# Patient Record
Sex: Female | Born: 1950
Health system: Southern US, Community
[De-identification: ages and names within clinical notes are randomized; demographics above are authoritative.]

## PROBLEM LIST (undated history)

## (undated) DIAGNOSIS — Z8673 Personal history of transient ischemic attack (TIA), and cerebral infarction without residual deficits: Secondary | ICD-10-CM

## (undated) DIAGNOSIS — F419 Anxiety disorder, unspecified: Secondary | ICD-10-CM

## (undated) DIAGNOSIS — M1990 Unspecified osteoarthritis, unspecified site: Secondary | ICD-10-CM

## (undated) DIAGNOSIS — R200 Anesthesia of skin: Secondary | ICD-10-CM

## (undated) DIAGNOSIS — Z9889 Other specified postprocedural states: Secondary | ICD-10-CM

## (undated) DIAGNOSIS — E039 Hypothyroidism, unspecified: Secondary | ICD-10-CM

## (undated) DIAGNOSIS — R112 Nausea with vomiting, unspecified: Secondary | ICD-10-CM

## (undated) DIAGNOSIS — I10 Essential (primary) hypertension: Secondary | ICD-10-CM

## (undated) DIAGNOSIS — K219 Gastro-esophageal reflux disease without esophagitis: Secondary | ICD-10-CM

## (undated) HISTORY — PX: CARPAL TUNNEL RELEASE: SHX101

## (undated) HISTORY — DX: Personal history of transient ischemic attack (TIA), and cerebral infarction without residual deficits: Z86.73

## (undated) HISTORY — PX: CHOLECYSTECTOMY: SHX55

---

## 2001-09-29 ENCOUNTER — Emergency Department (HOSPITAL_COMMUNITY): Admission: EM | Admit: 2001-09-29 | Discharge: 2001-09-29 | Payer: Self-pay | Admitting: Emergency Medicine

## 2001-09-29 ENCOUNTER — Encounter: Payer: Self-pay | Admitting: Emergency Medicine

## 2002-03-29 ENCOUNTER — Encounter: Payer: Self-pay | Admitting: Obstetrics and Gynecology

## 2002-03-29 ENCOUNTER — Ambulatory Visit (HOSPITAL_COMMUNITY): Admission: RE | Admit: 2002-03-29 | Discharge: 2002-03-29 | Payer: Self-pay | Admitting: Obstetrics and Gynecology

## 2002-04-02 ENCOUNTER — Ambulatory Visit (HOSPITAL_COMMUNITY): Admission: RE | Admit: 2002-04-02 | Discharge: 2002-04-02 | Payer: Self-pay | Admitting: Obstetrics and Gynecology

## 2002-04-02 ENCOUNTER — Encounter: Payer: Self-pay | Admitting: Obstetrics and Gynecology

## 2002-12-15 ENCOUNTER — Other Ambulatory Visit: Admission: RE | Admit: 2002-12-15 | Discharge: 2002-12-15 | Payer: Self-pay | Admitting: Obstetrics & Gynecology

## 2004-02-20 ENCOUNTER — Ambulatory Visit (HOSPITAL_COMMUNITY): Admission: RE | Admit: 2004-02-20 | Discharge: 2004-02-20 | Payer: Self-pay | Admitting: Family Medicine

## 2004-05-09 ENCOUNTER — Ambulatory Visit (HOSPITAL_COMMUNITY): Admission: RE | Admit: 2004-05-09 | Discharge: 2004-05-09 | Payer: Self-pay | Admitting: Internal Medicine

## 2005-01-23 ENCOUNTER — Ambulatory Visit (HOSPITAL_COMMUNITY): Admission: RE | Admit: 2005-01-23 | Discharge: 2005-01-23 | Payer: Self-pay | Admitting: Internal Medicine

## 2005-03-20 ENCOUNTER — Emergency Department (HOSPITAL_COMMUNITY): Admission: EM | Admit: 2005-03-20 | Discharge: 2005-03-21 | Payer: Self-pay | Admitting: *Deleted

## 2005-08-06 ENCOUNTER — Ambulatory Visit (HOSPITAL_COMMUNITY): Admission: RE | Admit: 2005-08-06 | Discharge: 2005-08-06 | Payer: Self-pay | Admitting: Internal Medicine

## 2006-10-29 ENCOUNTER — Ambulatory Visit (HOSPITAL_COMMUNITY): Admission: RE | Admit: 2006-10-29 | Discharge: 2006-10-29 | Payer: Self-pay | Admitting: Obstetrics and Gynecology

## 2007-03-30 ENCOUNTER — Emergency Department (HOSPITAL_COMMUNITY): Admission: EM | Admit: 2007-03-30 | Discharge: 2007-03-30 | Payer: Self-pay | Admitting: Emergency Medicine

## 2010-08-21 ENCOUNTER — Ambulatory Visit (HOSPITAL_COMMUNITY): Admission: RE | Admit: 2010-08-21 | Payer: Self-pay | Admitting: Family Medicine

## 2010-10-27 ENCOUNTER — Encounter: Payer: Self-pay | Admitting: Family Medicine

## 2010-11-14 ENCOUNTER — Other Ambulatory Visit: Payer: Self-pay | Admitting: Obstetrics and Gynecology

## 2010-11-14 DIAGNOSIS — Z139 Encounter for screening, unspecified: Secondary | ICD-10-CM

## 2010-11-19 ENCOUNTER — Ambulatory Visit (HOSPITAL_COMMUNITY)
Admission: RE | Admit: 2010-11-19 | Discharge: 2010-11-19 | Disposition: A | Discharge status: Home / Self Care | Payer: BC Managed Care – PPO | Source: Ambulatory Visit | Attending: Obstetrics and Gynecology | Admitting: Obstetrics and Gynecology

## 2010-11-19 DIAGNOSIS — Z1231 Encounter for screening mammogram for malignant neoplasm of breast: Secondary | ICD-10-CM | POA: Insufficient documentation

## 2010-11-19 DIAGNOSIS — Z139 Encounter for screening, unspecified: Secondary | ICD-10-CM

## 2011-02-22 NOTE — Consult Note (Signed)
NAME:  Holly Callahan, Holly Callahan                          ACCOUNT NO.:  [id number]   MEDICAL RECORD NO.:  [id number]                  PATIENT TYPE:   LOCATION:                                       FACILITY:   PHYSICIAN:  Lionel December, M.D.                 DATE OF BIRTH:  10/05/1958   DATE OF CONSULTATION:  DATE OF DISCHARGE:                                   CONSULTATION  (CORRECTED COPY)   Burning in esophagus and belching.   HISTORY OF PRESENT ILLNESS:  The patient states that approximately one week  ago she had an episode of esophageal burning and has been experiencing  increasing belching.  The patient has a several year history of GERD, for  which she takes Nexium 40 mg one q.d.  She stated that the esophageal  burning was unlike the reflux she had experienced in the past and she became  concerned.  The patient denies swallowing difficulties, nausea, vomiting or  hematemesis.  The patient states that she normally has one formed stool a  day.  She denies hematochezia or melena.  The patient states that she has  never had an EGD for her persistent GERD and, at age 60, has never had a  screening colonoscopy.  The patient states that she saw her primary care  physician, Dr. Nobie Putnam, who gave her dicyclomine 10 mg q.i.d., and she  states that her symptoms have resolved on this medication and with  continuing her Nexium.   CURRENT MEDICATIONS:  1. Xanax 0.5 mg p.r.n.  2. Nexium 40 mg one q.d.  3. Lexapro one q.d.  4. HCTZ 25 mg one q.d.  5. Dicyclomine 10 mg q.i.d.   PAST MEDICAL HISTORY:  1. Hypertension.  2. GERD, for which she took over the counter medications for several years     before starting on Prilosec and then starting Nexium.  3. The patient has had cholecystectomy in the remote past, she states more     than 10 years ago, and this was done by Dr. Katrinka Blazing.  4. The patient had a Cesarean delivery in 1988.   ALLERGIES:  The patient has no known drug allergies.   FAMILY  HISTORY:  The patient's mother is deceased; cause of death was a  stroke.  The patient's father is deceased, also of a stroke, and the  patient's father did have peptic ulcer disease.  The patient has seven  brothers, one of whom died in an automobile accident and the remaining six  are in good health.   SOCIAL HISTORY:  The patient is married.  She lives with her husband.  She  is employed at the [employer] as a Runner, broadcasting/film/video.  She denies the use  of tobacco, alcohol or street drugs.   PHYSICAL EXAMINATION:  HEENT:  Normocephalic, atraumatic.  Conjunctivae are  pink.  Sclerae are clear and nonicteric.  There are no masses,  lymphadenopathy palpated.  SKIN:  Dry and warm and without jaundice.  EXTREMITIES:  No edema, clubbing or cyanosis.  HEART:  Regular rate and rhythm without murmurs, rubs or gallops.  RESPIRATIONS:  Clear to auscultation bilaterally.  ABDOMEN:  Soft, nondistended, active bowel sounds times four, no tenderness  to palpation, no masses or hepatosplenomegaly.   The patient had a CT of the abdomen with contrast.  This was done at [facility] on Feb 20, 2004.  The liver, spleen, pancreas, adrenal glands  and kidneys appear normal.  The gallbladder has been removed.  There is no  adenopathy or bowel dilatation.  No free air or free fluid.  Impression:  Negative CT scan of the abdomen.   CT scan of the pelvis with contrast:  The appendix is prominent, measuring 7  to 8 mm in diameter and extending into the right side of the pelvis.  There  is no periappendicular inflammation.  Impression:  No acute abnormality.   IMPRESSION:  Long term gastroesophageal reflux disease, which has been  stable until this episode one week ago.  The patient's symptoms are being  controlled on dicyclomine and Nexium at this point.  The patient has never  had an esophagogastroduodenoscopy in spite of her long term gastroesophageal  reflux disease and this should be scheduled.   It is recommended that in the  near future, the patient have a screening colonoscopy, which could certainly  wait until this episode of exacerbation of gastroesophageal reflux disease  symptoms has been managed.   RECOMMENDATIONS:  Schedule patient for an EGD for exacerbation of chronic  GERD symptoms and consider scheduling the patient for a colonoscopy at a  near future date.   Thank you for allowing [place] to see this very pleasant patient.     ________________________________________  ___________________________________________  Ashok Pall, PA                           Lionel December, M.D.   GC/MEDQ  D:  02/22/2004  T:  02/23/2004  Job:  [id number]   cc:   Patrica Duel, M.D.  [address]  [place]  [place] [address]  Fax: [phone]

## 2011-02-22 NOTE — Op Note (Signed)
NAME:  Holly Callahan, Holly Callahan                          ACCOUNT NO.:  [id number]   MEDICAL RECORD NO.:  [id number]                   PATIENT TYPE:  AMB   LOCATION:  DAY                                  FACILITY:  [facility]   PHYSICIAN:  Lionel December, M.D.                 DATE OF BIRTH:  [birth year]   DATE OF PROCEDURE:  DATE OF DISCHARGE:                                 OPERATIVE REPORT   PROCEDURE:  Esophagogastroduodenoscopy.   INDICATIONS FOR PROCEDURE:  [name] is a 60 year old Caucasian female who has  at least a five-year history of GERD with intermittent symptom flareup.  She  is presently doing much better with the Nexium and some change in her eating  habits.  She is undergoing EGD to make sure she does not have Barrett's  esophagus.  The procedure risks were reviewed with the patient, and informed  consent for the procedure was obtained.   PREOPERATIVE MEDICATIONS:  Cetacaine spray for pharyngeal topical  anesthesia, Demerol 50 mg IV, Versed 10 mg IV in divided dose.   FINDINGS:  The procedure was performed in the endoscopy suite.  The  patient's vital signs and O2 saturations were monitored during the procedure  and remained stable.  The patient was placed in the left lateral recumbent  position, and the Olympus videoscope was passed via the oropharynx without  any difficulty into the esophagus.   Esophagus:  The mucosa of the esophagus was normal.  The squamocolumnar  junction was wavy, and there was a small sliding hiatal hernia no more than  3 cm in length.   Stomach:  It was empty and distended very well with insufflation.  The folds  of the proximal stomach were normal.  Examination of  the mucosa revealed  linear erosions at the antrum and some erythema, but no ulcer crater was  noted.  The pyloric channel was patent.  The angularis, fundus, and cardia  were examined by retroflexing the scope and were normal.   Duodenum:  Examination of the bulb revealed normal mucosa.  The  scope was  advanced to the postbulbar duodenum where the mucosa and folds were normal.  The endoscope was withdrawn.  The patient tolerated the procedure well.   FINAL DIAGNOSES:  1. Small sliding hiatal hernia without endoscopic changes of esophagitis or     Barrett's esophagus.  2. Erosive antral gastritis.   RECOMMENDATIONS:  1. She will continue antireflux measures and Nexium at 40 mg q.a.m.  Since     she is doing so well, if she wants to     she could try every other day.  2. H pylori serology will be checked.  3. The patient will call us when she is ready to undergo screening     colonoscopy.      ___________________________________________  Lionel December, M.D.   NR/MEDQ  D:  05/09/2004  T:  05/10/2004  Job:  [id number]   cc:   Patrica Duel, M.D.  [address]  [place]  [place] [address]  Fax: [phone]

## 2011-10-09 ENCOUNTER — Other Ambulatory Visit: Payer: Self-pay

## 2011-10-09 ENCOUNTER — Telehealth: Payer: Self-pay

## 2011-10-09 DIAGNOSIS — Z139 Encounter for screening, unspecified: Secondary | ICD-10-CM

## 2011-10-09 NOTE — Telephone Encounter (Signed)
Gastroenterology Pre-Procedure Form   Request Date: 10/09/2011       Requesting Physician: Dr. Sherwood Gambler     PATIENT INFORMATION:  Holly Callahan is a 61 y.o., female (DOB=[birth year]).  PROCEDURE: Procedure(s) requested: colonoscopy Procedure Reason: screening for colon cancer  PATIENT REVIEW QUESTIONS: The patient reports the following:   1. Diabetes Melitis: no 2. Joint replacements in the past 12 months: no 3. Major health problems in the past 3 months: no 4. Has an artificial valve or MVP:no 5. Has been advised in past to take antibiotics in advance of a procedure like teeth cleaning: no}    MEDICATIONS & ALLERGIES:    Patient reports the following regarding taking any blood thinners:   Plavix? no Aspirin?yes  Coumadin?  no  Patient confirms/reports the following medications:  Current Outpatient Prescriptions  Medication Sig Dispense Refill  . ALPRAZolam (XANAX) 0.5 MG tablet Take 0.5 mg by mouth 1 day or 1 dose. Pt takes only one tablet daily in the AM       . aspirin 81 MG tablet Take 160 mg by mouth daily.        . fish oil-omega-3 fatty acids 1000 MG capsule Take 1 g by mouth 2 (two) times daily.        . hydrochlorothiazide (HYDRODIURIL) 25 MG tablet Take 25 mg by mouth daily.        Marland Kitchen levothyroxine (SYNTHROID, LEVOTHROID) 25 MCG tablet Take 25 mcg by mouth daily.        . Multiple Vitamin (MULTIVITAMIN) tablet Take 1 tablet by mouth daily.        . NON FORMULARY Calcium 600 mg plus Vit D    One tablet twice daily         Patient confirms/reports the following allergies:  No Known Allergies  Patient is appropriate to schedule for requested procedure(s): yes  AUTHORIZATION INFORMATION Primary Insurance:   ID #:   Group #:  Pre-Cert / Auth required:  Pre-Cert / Auth #:   Secondary Insurance:   ID #:   Group #:  Pre-Cert / Auth required: Pre-Cert / Auth #:   No orders of the defined types were placed in this encounter.    SCHEDULE INFORMATION: Procedure has  been scheduled as follows:  Date: 10/28/2011      Time: 8:30 AM  Location: [facility] Short Stay  This Gastroenterology Pre-Precedure Form is being routed to the following provider(s) for review: R. Roetta Sessions, MD

## 2011-10-09 NOTE — Telephone Encounter (Signed)
OK as is.

## 2011-10-10 NOTE — Telephone Encounter (Signed)
Rx and instructions mailed to pt.  

## 2011-10-22 ENCOUNTER — Encounter (HOSPITAL_COMMUNITY): Payer: Self-pay | Admitting: Pharmacy Technician

## 2011-10-25 MED ORDER — SODIUM CHLORIDE 0.45 % IV SOLN
Freq: Once | INTRAVENOUS | Status: AC
  Administered 2011-10-28: 08:00:00 via INTRAVENOUS

## 2011-10-28 ENCOUNTER — Encounter (HOSPITAL_COMMUNITY)
Admission: RE | Disposition: A | Discharge status: Home / Self Care | Payer: Self-pay | Source: Ambulatory Visit | Attending: Internal Medicine

## 2011-10-28 ENCOUNTER — Other Ambulatory Visit: Payer: Self-pay | Admitting: Internal Medicine

## 2011-10-28 ENCOUNTER — Encounter (HOSPITAL_COMMUNITY): Payer: Self-pay | Admitting: *Deleted

## 2011-10-28 ENCOUNTER — Ambulatory Visit (HOSPITAL_COMMUNITY)
Admission: RE | Admit: 2011-10-28 | Discharge: 2011-10-28 | Disposition: A | Discharge status: Home / Self Care | Payer: BC Managed Care – PPO | Source: Ambulatory Visit | Attending: Internal Medicine | Admitting: Internal Medicine

## 2011-10-28 DIAGNOSIS — D126 Benign neoplasm of colon, unspecified: Secondary | ICD-10-CM | POA: Insufficient documentation

## 2011-10-28 DIAGNOSIS — Z1211 Encounter for screening for malignant neoplasm of colon: Secondary | ICD-10-CM

## 2011-10-28 DIAGNOSIS — Z139 Encounter for screening, unspecified: Secondary | ICD-10-CM

## 2011-10-28 DIAGNOSIS — I10 Essential (primary) hypertension: Secondary | ICD-10-CM | POA: Insufficient documentation

## 2011-10-28 DIAGNOSIS — Z79899 Other long term (current) drug therapy: Secondary | ICD-10-CM | POA: Insufficient documentation

## 2011-10-28 DIAGNOSIS — Z7982 Long term (current) use of aspirin: Secondary | ICD-10-CM | POA: Insufficient documentation

## 2011-10-28 DIAGNOSIS — R933 Abnormal findings on diagnostic imaging of other parts of digestive tract: Secondary | ICD-10-CM

## 2011-10-28 HISTORY — DX: Hypothyroidism, unspecified: E03.9

## 2011-10-28 HISTORY — DX: Essential (primary) hypertension: I10

## 2011-10-28 HISTORY — PX: COLONOSCOPY: SHX5424

## 2011-10-28 SURGERY — COLONOSCOPY
Anesthesia: Moderate Sedation

## 2011-10-28 MED ORDER — MIDAZOLAM HCL 5 MG/5ML IJ SOLN
INTRAMUSCULAR | Status: AC
  Filled 2011-10-28: qty 10

## 2011-10-28 MED ORDER — LIDOCAINE HCL 2 % EX GEL
CUTANEOUS | Status: AC
  Filled 2011-10-28: qty 30

## 2011-10-28 MED ORDER — STERILE WATER FOR IRRIGATION IR SOLN
Status: DC | PRN
  Administered 2011-10-28: 09:00:00

## 2011-10-28 MED ORDER — MEPERIDINE HCL 100 MG/ML IJ SOLN
INTRAMUSCULAR | Status: AC
  Filled 2011-10-28: qty 2

## 2011-10-28 MED ORDER — MEPERIDINE HCL 100 MG/ML IJ SOLN
INTRAMUSCULAR | Status: DC | PRN
  Administered 2011-10-28: 50 mg via INTRAVENOUS

## 2011-10-28 MED ORDER — MIDAZOLAM HCL 5 MG/5ML IJ SOLN
INTRAMUSCULAR | Status: DC | PRN
  Administered 2011-10-28 (×3): 1 mg via INTRAVENOUS
  Administered 2011-10-28: 2 mg via INTRAVENOUS

## 2011-10-28 NOTE — Op Note (Signed)
[facility] [address] [place], [place]  [address]  COLONOSCOPY PROCEDURE REPORT  PATIENT:  Holly Callahan, Holly Callahan  MR#:  [id number] BIRTHDATE:  [birth year], 60 yrs. old  GENDER:  female ENDOSCOPIST:  R. Roetta Sessions, MD FACP [facility] REF. BY:                   Artis Delay, M.D. PROCEDURE DATE:  10/28/2011 PROCEDURE:  Colonoscopy with biopsy and snare polypectomy  INDICATIONS:  First-ever average risk screening colonoscopy  INFORMED CONSENT:  The risks, benefits, alternatives and imponderables including but not limited to bleeding, perforation as well as the possibility of a missed lesion have been reviewed. The potential for biopsy, lesion removal, etc. have also been discussed.  Questions have been answered.  All parties agreeable. Please see the history and physical in the medical record for more information.  MEDICATIONS:  Versed 5 mg IV and Demerol 50 mg IV in divided doses.  DESCRIPTION OF PROCEDURE:  After a digital rectal exam was performed, the EC-[id number] ([id number]) colonoscope was advanced from the anus through the rectum and colon to the area of the cecum, ileocecal valve and appendiceal orifice.  The cecum was deeply intubated.  These structures were well-seen and photographed for the record.  From the level of the cecum and ileocecal valve, the scope was slowly and cautiously withdrawn.  The mucosal surfaces were carefully surveyed utilizing scope tip deflection to facilitate fold flattening as needed.  The scope was pulled down into the rectum where a thorough examination including retroflexion was performed. <<PROCEDUREIMAGES>>  FINDINGS:   adequate preparation(however, was suboptimal on the right side). Anal papilla; otherwise normal rectum. 6 mm polyp  in the ascending          colon. Slightly irregular mucosa at the opening of the ileocecal valve. May well be a variant of normal (image 3). Otherwise, normal appearing  colonic mucosa  THERAPEUTIC /  DIAGNOSTIC MANEUVERS PERFORMED:  polyp in the right colon removed with hot snare cautery. Abnormal appearing ileocecal valve cold biopsied  COMPLICATIONS:  none  CECAL WITHDRAWAL TIME:   16 minutes  IMPRESSION:  Colonic polyp. Abnormal appearing ileocecal  valve. Intervention as outlined above.  RECOMMENDATIONS:     Follow up on pathology  ______________________________ R. Roetta Sessions, MD Caleen Essex  CC:  Artis Delay, M.D.  n. eSIGNED:   R. Roetta Sessions at 10/28/2011 09:31 AM  Brendolyn Patty, [phone]

## 2011-10-28 NOTE — H&P (Signed)
Primary Care Physician:  Cassell Smiles., MD, MD Primary Gastroenterologist:  Dr.  Jena Gauss  Pre-Procedure History & Physical: HPI:  Holly Callahan is a 61 y.o. female is here for a screening colonoscopy.  No bowel symptoms. No prior colonoscopy. No family history of colon cancer or colon polyps.   Past Medical History  Diagnosis Date  . Hypertension   . Hypothyroidism     Past Surgical History  Procedure Date  . Cholecystectomy   . Cesarean section 1988    Prior to Admission medications   Medication Sig Start Date End Date Taking? Authorizing Provider  ALPRAZolam Prudy Feeler) 0.5 MG tablet Take 0.5 mg by mouth 4 (four) times daily as needed. Pt takes only one tablet daily in the AM and sometimes 1/2 tab at night if she feels she needs to   Yes Historical Provider, MD  aspirin 81 MG tablet Take 81 mg by mouth at bedtime.    Yes Historical Provider, MD  fish oil-omega-3 fatty acids 1000 MG capsule Take 1 g by mouth 2 (two) times daily.     Yes Historical Provider, MD  hydrochlorothiazide (HYDRODIURIL) 25 MG tablet Take 25 mg by mouth daily.     Yes Historical Provider, MD  levothyroxine (SYNTHROID, LEVOTHROID) 25 MCG tablet Take 25 mcg by mouth daily.     Yes Historical Provider, MD  Multiple Vitamin (MULTIVITAMIN) tablet Take 1 tablet by mouth daily.     Yes Historical Provider, MD  NON FORMULARY Calcium 600 mg plus Vit D    One tablet twice daily    Yes Historical Provider, MD    Allergies as of 10/09/2011  . (Not on File)    History reviewed. No pertinent family history.  History   Social History  . Marital Status: Married    Spouse Name: N/A    Number of Children: N/A  . Years of Education: N/A   Occupational History  . Not on file.   Social History Main Topics  . Smoking status: Never Smoker   . Smokeless tobacco: Not on file  . Alcohol Use: No  . Drug Use: No  . Sexually Active:    Other Topics Concern  . Not on file   Social History Narrative  . No narrative  on file    Review of Systems: See HPI, otherwise negative ROS  Physical Exam: BP 154/78  Pulse 57  Temp(Src) 97.7 F (36.5 C) (Oral)  Resp 16  Ht 5\' 3"  (1.6 m)  Wt 150 lb (68.04 kg)  BMI 26.57 kg/m2  SpO2 100% General:   Alert,  Well-developed, well-nourished, pleasant and cooperative in NAD Head:  Normocephalic and atraumatic. Eyes:  Sclera clear, no icterus.   Conjunctiva pink. Ears:  Normal auditory acuity. Nose:  No deformity, discharge,  or lesions. Mouth:  No deformity or lesions, dentition normal. Neck:  Supple; no masses or thyromegaly. Lungs:  Clear throughout to auscultation.   No wheezes, crackles, or rhonchi. No acute distress. Heart:  Regular rate and rhythm; no murmurs, clicks, rubs,  or gallops. Abdomen: Nondistended. Positive bowel sounds soft nontender without appreciable mass or organomegaly Msk:  Symmetrical without gross deformities. Normal posture. Pulses:  Normal pulses noted. Extremities:  Without clubbing or edema. Neurologic:  Alert and  oriented x4;  grossly normal neurologically. Skin:  Intact without significant lesions or rashes. Cervical Nodes:  No significant cervical adenopathy. Psych:  Alert and cooperative. Normal mood and affect.  Impression/Plan: Holly Callahan is now here to undergo a  screening colonoscopy. First-ever average risk screening examination.  Risks, benefits, limitations, imponderables and alternatives regarding colonoscopy have been reviewed with the patient. Questions have been answered. All parties agreeable.

## 2011-11-03 ENCOUNTER — Encounter: Payer: Self-pay | Admitting: Internal Medicine

## 2011-11-04 ENCOUNTER — Encounter (HOSPITAL_COMMUNITY): Payer: Self-pay | Admitting: Internal Medicine

## 2012-06-10 ENCOUNTER — Other Ambulatory Visit: Payer: Self-pay | Admitting: Adult Health

## 2012-06-10 DIAGNOSIS — Z139 Encounter for screening, unspecified: Secondary | ICD-10-CM

## 2012-06-15 ENCOUNTER — Ambulatory Visit (HOSPITAL_COMMUNITY): Payer: BC Managed Care – PPO

## 2012-06-19 ENCOUNTER — Ambulatory Visit (HOSPITAL_COMMUNITY)
Admission: RE | Admit: 2012-06-19 | Discharge: 2012-06-19 | Disposition: A | Discharge status: Home / Self Care | Payer: BC Managed Care – PPO | Source: Ambulatory Visit | Attending: Adult Health | Admitting: Adult Health

## 2012-06-19 DIAGNOSIS — Z139 Encounter for screening, unspecified: Secondary | ICD-10-CM

## 2012-06-19 DIAGNOSIS — Z1231 Encounter for screening mammogram for malignant neoplasm of breast: Secondary | ICD-10-CM | POA: Insufficient documentation

## 2012-10-07 DIAGNOSIS — G459 Transient cerebral ischemic attack, unspecified: Secondary | ICD-10-CM

## 2012-10-07 HISTORY — DX: Transient cerebral ischemic attack, unspecified: G45.9

## 2015-07-11 ENCOUNTER — Other Ambulatory Visit: Payer: Self-pay | Admitting: Surgical

## 2015-09-21 NOTE — H&P (Signed)
TOTAL KNEE ADMISSION H&P  Patient is being admitted for right total knee arthroplasty.  Subjective:  Chief Complaint:right knee pain.  HPI: Holly Callahan, 64 y.o. female, has a history of pain and functional disability in the right knee due to arthritis and has failed non-surgical conservative treatments for greater than 12 weeks to includeNSAID's and/or analgesics, corticosteriod injections, flexibility and strengthening excercises and activity modification.  Onset of symptoms was gradual, starting >10 years ago with gradually worsening course since that time. The patient noted no past surgery on the right knee(s).  Patient currently rates pain in the right knee(s) at 7 out of 10 with activity. Patient has night pain, worsening of pain with activity and weight bearing, pain that interferes with activities of daily living, pain with passive range of motion, crepitus and joint swelling.  Patient has evidence of periarticular osteophytes and joint space narrowing by imaging studies.  There is no active infection.   Past Medical History  Diagnosis Date  . Hypertension   . Hypothyroidism     Past Surgical History  Procedure Laterality Date  . Cholecystectomy    . Cesarean section  1988  . Colonoscopy  10/28/2011    Procedure: COLONOSCOPY;  Surgeon: Daneil Dolin, MD;  Location: [facility] ENDO SUITE;  Service: Endoscopy;  Laterality: N/A;  8:30 AM      Current outpatient prescriptions:  .  ALPRAZolam (XANAX) 0.5 MG tablet, Take 0.25-0.5 mg by mouth 4 (four) times daily as needed for anxiety or sleep. Pt usually only takes only 1/2 tab at night, Disp: , Rfl:  .  aspirin 81 MG tablet, Take 81 mg by mouth at bedtime. , Disp: , Rfl:  .  Calcium Carb-Cholecalciferol (CALCIUM 600 + D PO), Take 1 tablet by mouth 2 (two) times daily., Disp: , Rfl:  .  fish oil-omega-3 fatty acids 1000 MG capsule, Take 1 g by mouth 2 (two) times daily.  , Disp: , Rfl:  .  hydrochlorothiazide (HYDRODIURIL) 25 MG tablet,  Take 25 mg by mouth daily.  , Disp: , Rfl:  .  ibuprofen (ADVIL,MOTRIN) 200 MG tablet, Take 400 mg by mouth every 6 (six) hours as needed for mild pain., Disp: , Rfl:  .  levothyroxine (SYNTHROID, LEVOTHROID) 25 MCG tablet, Take 25 mcg by mouth daily.  , Disp: , Rfl:  .  omeprazole (PRILOSEC) 40 MG capsule, Take 40 mg by mouth daily., Disp: , Rfl:   No Known Allergies  Social History  Substance Use Topics  . Smoking status: Never Smoker   . Smokeless tobacco: No  . Alcohol Use: No      Review of Systems  Constitutional: Negative.   HENT: Negative.   Eyes: Negative.   Respiratory: Negative.   Cardiovascular: Negative.   Gastrointestinal: Positive for heartburn. Negative for nausea, vomiting, abdominal pain, diarrhea, constipation, blood in stool and melena.  Genitourinary: Positive for frequency. Negative for dysuria, urgency, hematuria and flank pain.  Musculoskeletal: Positive for joint pain. Negative for myalgias, back pain, falls and neck pain.       Right knee pain  Skin: Negative.   Neurological: Negative.   Endo/Heme/Allergies: Negative.   Psychiatric/Behavioral: Negative.     Objective:  Physical Exam  Constitutional: She is oriented to person, place, and time. She appears well-developed and well-nourished. No distress.  HENT:  Head: Normocephalic and atraumatic.  Right Ear: External ear normal.  Left Ear: External ear normal.  Nose: Nose normal.  Mouth/Throat: Oropharynx is clear and moist.  Eyes: Conjunctivae and EOM are normal.  Neck: Normal range of motion. Neck supple.  Cardiovascular: Normal rate, regular rhythm, normal heart sounds and intact distal pulses.   No murmur heard. Respiratory: Effort normal and breath sounds normal. No respiratory distress. She has no wheezes.  GI: Soft. She exhibits no distension. There is no tenderness.  Musculoskeletal:       Right hip: Normal.       Left hip: Normal.       Right knee: She exhibits decreased range of  motion and swelling. She exhibits no effusion and no erythema. Tenderness found. Medial joint line and lateral joint line tenderness noted.       Left knee: Normal.  Neurological: She is alert and oriented to person, place, and time. She has normal strength and normal reflexes. No sensory deficit.  Skin: No rash noted. She is not diaphoretic. No erythema.  Psychiatric: She has a normal mood and affect. Her behavior is normal.   Vitals  Weight: 140 lb Height: 62in Body Surface Area: 1.64 m Body Mass Index: 25.61 kg/m  Pulse: 76 (Regular)  BP: 148/84 (Sitting, Left Arm, Standard)  Imaging Review Plain radiographs demonstrate severe degenerative joint disease of the right knee(s). The overall alignment ismild varus. The bone quality appears to be excellent for age and reported activity level.  Assessment/Plan:  End stage primary osteoarthritis, right knee   The patient history, physical examination, clinical judgment of the provider and imaging studies are consistent with end stage degenerative joint disease of the right knee(s) and total knee arthroplasty is deemed medically necessary. The treatment options including medical management, injection therapy arthroscopy and arthroplasty were discussed at length. The risks and benefits of total knee arthroplasty were presented and reviewed. The risks due to aseptic loosening, infection, stiffness, patella tracking problems, thromboembolic complications and other imponderables were discussed. The patient acknowledged the explanation, agreed to proceed with the plan and consent was signed. Patient is being admitted for inpatient treatment for surgery, pain control, PT, OT, prophylactic antibiotics, VTE prophylaxis, progressive ambulation and ADL's and discharge planning. The patient is planning to be discharged home with home health services    PCP: Dr. Francia Greaves, PA-C

## 2015-09-27 ENCOUNTER — Encounter (HOSPITAL_COMMUNITY)
Admission: RE | Admit: 2015-09-27 | Discharge: 2015-09-27 | Disposition: A | Discharge status: Home / Self Care | Payer: 59 | Source: Ambulatory Visit | Attending: Orthopedic Surgery | Admitting: Orthopedic Surgery

## 2015-09-27 ENCOUNTER — Encounter (HOSPITAL_COMMUNITY): Payer: Self-pay

## 2015-09-27 ENCOUNTER — Ambulatory Visit (HOSPITAL_COMMUNITY)
Admission: RE | Admit: 2015-09-27 | Discharge: 2015-09-27 | Disposition: A | Discharge status: Home / Self Care | Payer: 59 | Source: Ambulatory Visit | Attending: Surgical | Admitting: Surgical

## 2015-09-27 DIAGNOSIS — Z01818 Encounter for other preprocedural examination: Secondary | ICD-10-CM | POA: Insufficient documentation

## 2015-09-27 DIAGNOSIS — M5134 Other intervertebral disc degeneration, thoracic region: Secondary | ICD-10-CM | POA: Diagnosis not present

## 2015-09-27 HISTORY — DX: Unspecified osteoarthritis, unspecified site: M19.90

## 2015-09-27 HISTORY — DX: Anxiety disorder, unspecified: F41.9

## 2015-09-27 HISTORY — DX: Gastro-esophageal reflux disease without esophagitis: K21.9

## 2015-09-27 HISTORY — DX: Anesthesia of skin: R20.0

## 2015-09-27 HISTORY — DX: Nausea with vomiting, unspecified: R11.2

## 2015-09-27 HISTORY — DX: Nausea with vomiting, unspecified: Z98.890

## 2015-09-27 LAB — URINALYSIS, ROUTINE W REFLEX MICROSCOPIC
Bilirubin Urine: NEGATIVE
Glucose, UA: NEGATIVE mg/dL
Hgb urine dipstick: NEGATIVE
Ketones, ur: NEGATIVE mg/dL
Leukocytes, UA: NEGATIVE
Nitrite: NEGATIVE
Protein, ur: NEGATIVE mg/dL
Specific Gravity, Urine: 1.012 (ref 1.005–1.030)
pH: 6.5 (ref 5.0–8.0)

## 2015-09-27 LAB — CBC WITH DIFFERENTIAL/PLATELET
Basophils Absolute: 0 10*3/uL (ref 0.0–0.1)
Basophils Relative: 0 %
Eosinophils Absolute: 0.1 10*3/uL (ref 0.0–0.7)
Eosinophils Relative: 2 %
HCT: 39.4 % (ref 36.0–46.0)
Hemoglobin: 13 g/dL (ref 12.0–15.0)
Lymphocytes Relative: 30 %
Lymphs Abs: 1.8 10*3/uL (ref 0.7–4.0)
MCH: 29.3 pg (ref 26.0–34.0)
MCHC: 33 g/dL (ref 30.0–36.0)
MCV: 88.7 fL (ref 78.0–100.0)
Monocytes Absolute: 0.4 10*3/uL (ref 0.1–1.0)
Monocytes Relative: 7 %
Neutro Abs: 3.8 10*3/uL (ref 1.7–7.7)
Neutrophils Relative %: 61 %
Platelets: 305 10*3/uL (ref 150–400)
RBC: 4.44 MIL/uL (ref 3.87–5.11)
RDW: 12.5 % (ref 11.5–15.5)
WBC: 6.2 10*3/uL (ref 4.0–10.5)

## 2015-09-27 LAB — PROTIME-INR
INR: 0.97 (ref 0.00–1.49)
Prothrombin Time: 13.1 seconds (ref 11.6–15.2)

## 2015-09-27 LAB — COMPREHENSIVE METABOLIC PANEL
ALT: 37 U/L (ref 14–54)
AST: 31 U/L (ref 15–41)
Albumin: 4.1 g/dL (ref 3.5–5.0)
Alkaline Phosphatase: 79 U/L (ref 38–126)
Anion gap: 9 (ref 5–15)
BUN: 24 mg/dL — ABNORMAL HIGH (ref 6–20)
CO2: 27 mmol/L (ref 22–32)
Calcium: 9.6 mg/dL (ref 8.9–10.3)
Chloride: 103 mmol/L (ref 101–111)
Creatinine, Ser: 0.92 mg/dL (ref 0.44–1.00)
GFR calc Af Amer: >60 mL/min (ref >60)
GFR calc non Af Amer: >60 mL/min (ref >60)
Glucose, Bld: 132 mg/dL — ABNORMAL HIGH (ref 65–99)
Potassium: 4 mmol/L (ref 3.5–5.1)
Sodium: 139 mmol/L (ref 135–145)
Total Bilirubin: 0.5 mg/dL (ref 0.3–1.2)
Total Protein: 7.3 g/dL (ref 6.5–8.1)

## 2015-09-27 LAB — ABO/RH: ABO/RH(D): O POS

## 2015-09-27 LAB — SURGICAL PCR SCREEN
MRSA, PCR: NEGATIVE
Staphylococcus aureus: NEGATIVE

## 2015-09-27 NOTE — Patient Instructions (Signed)
Holly Callahan  09/27/2015   Your procedure is scheduled on: Wednesday October 04, 2015   Report to [facility] Main  Entrance take [place]  elevators to 3rd floor to  [place] at 5:30 AM.  Call this number if you have problems the morning of surgery [phone]   Remember: ONLY 1 PERSON MAY GO WITH YOU TO SHORT STAY TO GET  READY MORNING OF [place].  Do not eat food or drink liquids :After Midnight.     Take these medicines the morning of surgery with A SIP OF WATER: Alprazolam (Xanax) if needed; Levothyroxine; Omeprazole (Prilosec)                                 You may not have any metal on your body including hair pins and              piercings  Do not wear jewelry, make-up, lotions, powders or perfumes, deodorant             Do not wear nail polish.  Do not shave  48 hours prior to surgery.               Do not bring valuables to the hospital. [place].  Contacts, dentures or bridgework may not be worn into surgery.  Leave suitcase in the car. After surgery it may be brought to your room.              Please read over the following fact sheets you were given:MRSA INFORMATION SHEET; INCENTIVE SPIROMETER; BLOOD TRANSFUSION INFORMATION SHEET  _____________________________________________________________________             [facility] - Preparing for Surgery Before surgery, you can play an important role.  Because skin is not sterile, your skin needs to be as free of germs as possible.  You can reduce the number of germs on your skin by washing with CHG (chlorahexidine gluconate) soap before surgery.  CHG is an antiseptic cleaner which kills germs and bonds with the skin to continue killing germs even after washing. Please DO NOT use if you have an allergy to CHG or antibacterial soaps.  If your skin becomes reddened/irritated stop using the CHG and inform your nurse when you arrive at Short  Stay. Do not shave (including legs and underarms) for at least 48 hours prior to the first CHG shower.  You may shave your face/neck. Please follow these instructions carefully:  1.  Shower with CHG Soap the night before surgery and the  morning of Surgery.  2.  If you choose to wash your hair, wash your hair first as usual with your  normal  shampoo.  3.  After you shampoo, rinse your hair and body thoroughly to remove the  shampoo.                           4.  Use CHG as you would any other liquid soap.  You can apply chg directly  to the skin and wash                       Gently with a scrungie or  clean washcloth.  5.  Apply the CHG Soap to your body ONLY FROM THE NECK DOWN.   Do not use on face/ open                           Wound or open sores. Avoid contact with eyes, ears mouth and genitals (private parts).                       Wash face,  Genitals (private parts) with your normal soap.             6.  Wash thoroughly, paying special attention to the area where your surgery  will be performed.  7.  Thoroughly rinse your body with warm water from the neck down.  8.  DO NOT shower/wash with your normal soap after using and rinsing off  the CHG Soap.                9.  Pat yourself dry with a clean towel.            10.  Wear clean pajamas.            11.  Place clean sheets on your bed the night of your first shower and do not  sleep with pets. Day of Surgery : Do not apply any lotions/deodorants the morning of surgery.  Please wear clean clothes to the hospital/surgery center.  FAILURE TO FOLLOW THESE INSTRUCTIONS MAY RESULT IN THE CANCELLATION OF YOUR SURGERY PATIENT SIGNATURE_________________________________  NURSE SIGNATURE__________________________________  ________________________________________________________________________   Adam Phenix  An incentive spirometer is a tool that can help keep your lungs clear and active. This tool measures how well you are  filling your lungs with each breath. Taking long deep breaths may help reverse or decrease the chance of developing breathing (pulmonary) problems (especially infection) following:  A long period of time when you are unable to move or be active. BEFORE THE PROCEDURE   If the spirometer includes an indicator to show your best effort, your nurse or respiratory therapist will set it to a desired goal.  If possible, sit up straight or lean slightly forward. Try not to slouch.  Hold the incentive spirometer in an upright position. INSTRUCTIONS FOR USE   Sit on the edge of your bed if possible, or sit up as far as you can in bed or on a chair.  Hold the incentive spirometer in an upright position.  Breathe out normally.  Place the mouthpiece in your mouth and seal your lips tightly around it.  Breathe in slowly and as deeply as possible, raising the piston or the ball toward the top of the column.  Hold your breath for 3-5 seconds or for as long as possible. Allow the piston or ball to fall to the bottom of the column.  Remove the mouthpiece from your mouth and breathe out normally.  Rest for a few seconds and repeat Steps 1 through 7 at least 10 times every 1-2 hours when you are awake. Take your time and take a few normal breaths between deep breaths.  The spirometer may include an indicator to show your best effort. Use the indicator as a goal to work toward during each repetition.  After each set of 10 deep breaths, practice coughing to be sure your lungs are clear. If you have an incision (the cut made at the time of surgery), support your incision when coughing  by placing a pillow or rolled up towels firmly against it. Once you are able to get out of bed, walk around indoors and cough well. You may stop using the incentive spirometer when instructed by your caregiver.  RISKS AND COMPLICATIONS  Take your time so you do not get dizzy or light-headed.  If you are in pain, you may  need to take or ask for pain medication before doing incentive spirometry. It is harder to take a deep breath if you are having pain. AFTER USE  Rest and breathe slowly and easily.  It can be helpful to keep track of a log of your progress. Your caregiver can provide you with a simple table to help with this. If you are using the spirometer at home, follow these instructions: [place] IF:   You are having difficultly using the spirometer.  You have trouble using the spirometer as often as instructed.  Your pain medication is not giving enough relief while using the spirometer.  You develop fever of 100.5 F (38.1 C) or higher. SEEK IMMEDIATE MEDICAL CARE IF:   You cough up bloody sputum that had not been present before.  You develop fever of 102 F (38.9 C) or greater.  You develop worsening pain at or near the incision site. MAKE SURE YOU:   Understand these instructions.  Will watch your condition.  Will get help right away if you are not doing well or get worse. Document Released: 02/03/2007 Document Revised: 12/16/2011 Document Reviewed: 04/06/2007 Exit[employer] Patient Information 2014 [employer], [place].   ________________________________________________________________________  WHAT IS A BLOOD TRANSFUSION? Blood Transfusion Information  A transfusion is the replacement of blood or some of its parts. Blood is made up of multiple cells which provide different functions.  Red blood cells carry oxygen and are used for blood loss replacement.  White blood cells fight against infection.  Platelets control bleeding.  Plasma helps clot blood.  Other blood products are available for specialized needs, such as hemophilia or other clotting disorders. BEFORE THE TRANSFUSION  Who gives blood for transfusions?   Healthy volunteers who are fully evaluated to make sure their blood is safe. This is blood bank blood. Transfusion therapy is the safest it has ever been in  the practice of medicine. Before blood is taken from a donor, a complete history is taken to make sure that person has no history of diseases nor engages in risky social behavior (examples are intravenous drug use or sexual activity with multiple partners). The donor's travel history is screened to minimize risk of transmitting infections, such as malaria. The donated blood is tested for signs of infectious diseases, such as HIV and hepatitis. The blood is then tested to be sure it is compatible with you in order to minimize the chance of a transfusion reaction. If you or a relative donates blood, this is often done in anticipation of surgery and is not appropriate for emergency situations. It takes many days to process the donated blood. RISKS AND COMPLICATIONS Although transfusion therapy is very safe and saves many lives, the main dangers of transfusion include:   Getting an infectious disease.  Developing a transfusion reaction. This is an allergic reaction to something in the blood you were given. Every precaution is taken to prevent this. The decision to have a blood transfusion has been considered carefully by your caregiver before blood is given. Blood is not given unless the benefits outweigh the risks. AFTER THE TRANSFUSION  Right after receiving a  blood transfusion, you will usually feel much better and more energetic. This is especially true if your red blood cells have gotten low (anemic). The transfusion raises the level of the red blood cells which carry oxygen, and this usually causes an energy increase.  The nurse administering the transfusion will monitor you carefully for complications. HOME CARE INSTRUCTIONS  No special instructions are needed after a transfusion. You may find your energy is better. Speak with your caregiver about any limitations on activity for underlying diseases you may have. SEEK MEDICAL CARE IF:   Your condition is not improving after your  transfusion.  You develop redness or irritation at the intravenous (IV) site. SEEK IMMEDIATE MEDICAL CARE IF:  Any of the following symptoms occur over the next 12 hours:  Shaking chills.  You have a temperature by mouth above 102 F (38.9 C), not controlled by medicine.  Chest, back, or muscle pain.  People around you feel you are not acting correctly or are confused.  Shortness of breath or difficulty breathing.  Dizziness and fainting.  You get a rash or develop hives.  You have a decrease in urine output.  Your urine turns a dark color or changes to pink, red, or brown. Any of the following symptoms occur over the next 10 days:  You have a temperature by mouth above 102 F (38.9 C), not controlled by medicine.  Shortness of breath.  Weakness after normal activity.  The white part of the eye turns yellow (jaundice).  You have a decrease in the amount of urine or are urinating less often.  Your urine turns a dark color or changes to pink, red, or brown. Document Released: 09/20/2000 Document Revised: 12/16/2011 Document Reviewed: 05/09/2008 [facility] Patient Information 2014 [employer], [place].  _______________________________________________________________________

## 2015-09-27 NOTE — Progress Notes (Signed)
CMP results in epic per PAT visit 09/27/2015 sent to Dr Gladstone Lighter

## 2015-09-28 NOTE — Progress Notes (Signed)
Dr B Judd/anesthesia reviewed pts H&P and EKG 09/27/2015/epic. No orders given. Anesthesia to see pt day of surgery.

## 2015-10-03 ENCOUNTER — Encounter (HOSPITAL_COMMUNITY): Payer: Self-pay | Admitting: Anesthesiology

## 2015-10-03 NOTE — Anesthesia Preprocedure Evaluation (Addendum)
Anesthesia Evaluation  Patient identified by MRN, date of birth, ID band Patient awake    Reviewed: Allergy & Precautions, NPO status , Patient's Chart, lab work & pertinent test results  History of Anesthesia Complications (+) PONV and history of anesthetic complications  Airway Mallampati: II  TM Distance: >3 FB Neck ROM: Full    Dental no notable dental hx.    Pulmonary neg pulmonary ROS,  CXR: Mild chronic bronchitic changes   Pulmonary exam normal breath sounds clear to auscultation       Cardiovascular hypertension, Pt. on medications Normal cardiovascular exam Rhythm:Regular Rate:Normal  ECG: NSR, rate 65, NS TWA   Neuro/Psych Anxiety negative neurological ROS     GI/Hepatic Neg liver ROS, GERD  Medicated,  Endo/Other  Hypothyroidism   Renal/GU negative Renal ROS  negative genitourinary   Musculoskeletal  (+) Arthritis ,   Abdominal   Peds negative pediatric ROS (+)  Hematology negative hematology ROS (+)   Anesthesia Other Findings   Reproductive/Obstetrics negative OB ROS                            Anesthesia Physical Anesthesia Plan  ASA: II  Anesthesia Plan: Spinal   Post-op Pain Management:    Induction: Intravenous  Airway Management Planned: Natural Airway  Additional Equipment:   Intra-op Plan:   Post-operative Plan:   Informed Consent: I have reviewed the patients History and Physical, chart, labs and discussed the procedure including the risks, benefits and alternatives for the proposed anesthesia with the patient or authorized representative who has indicated his/her understanding and acceptance.   Dental advisory given  Plan Discussed with: CRNA  Anesthesia Plan Comments: (Discussed risks and benefits of and differences between spinal and general. Discussed risks of spinal including headache, backache, failure, bleeding and hematoma, infection, and  nerve damage. Patient consents to spinal. Questions answered. Coagulation studies and platelet count acceptable.)       Anesthesia Quick Evaluation

## 2015-10-04 ENCOUNTER — Encounter (HOSPITAL_COMMUNITY): Payer: Self-pay | Admitting: *Deleted

## 2015-10-04 ENCOUNTER — Encounter (HOSPITAL_COMMUNITY)
Admission: RE | Disposition: A | Discharge status: Home Health | Payer: Self-pay | Source: Ambulatory Visit | Attending: Orthopedic Surgery

## 2015-10-04 ENCOUNTER — Inpatient Hospital Stay (HOSPITAL_COMMUNITY)
Admission: RE | Admit: 2015-10-04 | Discharge: 2015-10-06 | DRG: 470 | Disposition: A | Discharge status: Home Health | Payer: 59 | Source: Ambulatory Visit | Attending: Orthopedic Surgery | Admitting: Orthopedic Surgery

## 2015-10-04 ENCOUNTER — Inpatient Hospital Stay (HOSPITAL_COMMUNITY): Payer: 59 | Admitting: Anesthesiology

## 2015-10-04 DIAGNOSIS — E039 Hypothyroidism, unspecified: Secondary | ICD-10-CM | POA: Diagnosis present

## 2015-10-04 DIAGNOSIS — Z7982 Long term (current) use of aspirin: Secondary | ICD-10-CM

## 2015-10-04 DIAGNOSIS — F419 Anxiety disorder, unspecified: Secondary | ICD-10-CM | POA: Diagnosis present

## 2015-10-04 DIAGNOSIS — I10 Essential (primary) hypertension: Secondary | ICD-10-CM | POA: Diagnosis present

## 2015-10-04 DIAGNOSIS — K219 Gastro-esophageal reflux disease without esophagitis: Secondary | ICD-10-CM | POA: Diagnosis present

## 2015-10-04 DIAGNOSIS — Z96659 Presence of unspecified artificial knee joint: Secondary | ICD-10-CM

## 2015-10-04 DIAGNOSIS — M1711 Unilateral primary osteoarthritis, right knee: Principal | ICD-10-CM | POA: Diagnosis present

## 2015-10-04 DIAGNOSIS — M25561 Pain in right knee: Secondary | ICD-10-CM | POA: Diagnosis present

## 2015-10-04 DIAGNOSIS — Z79899 Other long term (current) drug therapy: Secondary | ICD-10-CM

## 2015-10-04 HISTORY — PX: TOTAL KNEE ARTHROPLASTY: SHX125

## 2015-10-04 LAB — TYPE AND SCREEN
ABO/RH(D): O POS
Antibody Screen: NEGATIVE

## 2015-10-04 SURGERY — ARTHROPLASTY, KNEE, TOTAL
Anesthesia: Spinal | Site: Knee | Laterality: Right

## 2015-10-04 MED ORDER — PROPOFOL 500 MG/50ML IV EMUL
INTRAVENOUS | Status: DC | PRN
  Administered 2015-10-04: 50 ug/kg/min via INTRAVENOUS

## 2015-10-04 MED ORDER — PROPOFOL 10 MG/ML IV BOLUS
INTRAVENOUS | Status: AC
  Filled 2015-10-04: qty 20

## 2015-10-04 MED ORDER — CHLORHEXIDINE GLUCONATE 4 % EX LIQD: 60.0000 mL | Freq: Once | CUTANEOUS | Status: DC

## 2015-10-04 MED ORDER — BUPIVACAINE IN DEXTROSE 0.75-8.25 % IT SOLN
INTRATHECAL | Status: DC | PRN
  Administered 2015-10-04: 2 mL via INTRATHECAL

## 2015-10-04 MED ORDER — POLYETHYLENE GLYCOL 3350 17 G PO PACK: 17.0000 g | PACK | Freq: Every day | ORAL | Status: DC | PRN

## 2015-10-04 MED ORDER — LIDOCAINE HCL (CARDIAC) 20 MG/ML IV SOLN
INTRAVENOUS | Status: DC | PRN
  Administered 2015-10-04: 100 mg via INTRAVENOUS

## 2015-10-04 MED ORDER — METHOCARBAMOL 1000 MG/10ML IJ SOLN
500.0000 mg | Freq: Four times a day (QID) | INTRAVENOUS | Status: DC | PRN
  Administered 2015-10-04: 500 mg via INTRAVENOUS
  Filled 2015-10-04 (×2): qty 5

## 2015-10-04 MED ORDER — EPHEDRINE SULFATE 50 MG/ML IJ SOLN
INTRAMUSCULAR | Status: AC
  Filled 2015-10-04: qty 1

## 2015-10-04 MED ORDER — SODIUM CHLORIDE 0.9 % IJ SOLN
INTRAMUSCULAR | Status: AC
  Filled 2015-10-04: qty 50

## 2015-10-04 MED ORDER — RIVAROXABAN 10 MG PO TABS
10.0000 mg | ORAL_TABLET | Freq: Every day | ORAL | Status: DC
Start: 2015-10-05 — End: 2015-10-06
  Administered 2015-10-05 – 2015-10-06 (×2): 10 mg via ORAL
  Filled 2015-10-04 (×3): qty 1

## 2015-10-04 MED ORDER — LACTATED RINGERS IV SOLN
INTRAVENOUS | Status: DC
  Administered 2015-10-04: 21:00:00 via INTRAVENOUS
  Administered 2015-10-04: 100 mL/h via INTRAVENOUS
  Administered 2015-10-05: 07:00:00 via INTRAVENOUS

## 2015-10-04 MED ORDER — METHOCARBAMOL 500 MG PO TABS
500.0000 mg | ORAL_TABLET | Freq: Four times a day (QID) | ORAL | Status: DC | PRN
  Administered 2015-10-04 – 2015-10-06 (×6): 500 mg via ORAL
  Filled 2015-10-04 (×6): qty 1

## 2015-10-04 MED ORDER — ONDANSETRON HCL 4 MG PO TABS
4.0000 mg | ORAL_TABLET | Freq: Four times a day (QID) | ORAL | Status: DC | PRN
  Administered 2015-10-05: 4 mg via ORAL
  Filled 2015-10-04: qty 1

## 2015-10-04 MED ORDER — FLEET ENEMA 7-19 GM/118ML RE ENEM: 1.0000 | ENEMA | Freq: Once | RECTAL | Status: DC | PRN

## 2015-10-04 MED ORDER — OXYCODONE-ACETAMINOPHEN 5-325 MG PO TABS
2.0000 | ORAL_TABLET | ORAL | Status: DC | PRN
  Administered 2015-10-04 – 2015-10-06 (×11): 2 via ORAL
  Filled 2015-10-04 (×11): qty 2

## 2015-10-04 MED ORDER — PROMETHAZINE HCL 25 MG/ML IJ SOLN
6.2500 mg | Freq: Four times a day (QID) | INTRAMUSCULAR | Status: DC | PRN
  Administered 2015-10-04: 6.25 mg via INTRAVENOUS
  Filled 2015-10-04: qty 1

## 2015-10-04 MED ORDER — HYDROMORPHONE HCL 1 MG/ML IJ SOLN
1.0000 mg | INTRAMUSCULAR | Status: DC | PRN
  Administered 2015-10-05 – 2015-10-06 (×2): 0.5 mg via INTRAVENOUS
  Filled 2015-10-04 (×2): qty 1

## 2015-10-04 MED ORDER — PROMETHAZINE HCL 25 MG/ML IJ SOLN: 6.2500 mg | INTRAMUSCULAR | Status: DC | PRN

## 2015-10-04 MED ORDER — FENTANYL CITRATE (PF) 100 MCG/2ML IJ SOLN
INTRAMUSCULAR | Status: AC
  Filled 2015-10-04: qty 2

## 2015-10-04 MED ORDER — BUPIVACAINE-EPINEPHRINE (PF) 0.25% -1:200000 IJ SOLN
INTRAMUSCULAR | Status: AC
  Filled 2015-10-04: qty 30

## 2015-10-04 MED ORDER — LACTATED RINGERS IV SOLN
INTRAVENOUS | Status: DC
  Administered 2015-10-04: 07:00:00 via INTRAVENOUS

## 2015-10-04 MED ORDER — CEFAZOLIN SODIUM 1-5 GM-% IV SOLN
1.0000 g | Freq: Four times a day (QID) | INTRAVENOUS | Status: AC
  Administered 2015-10-04 (×2): 1 g via INTRAVENOUS
  Filled 2015-10-04 (×2): qty 50

## 2015-10-04 MED ORDER — MENTHOL 3 MG MT LOZG: 1.0000 | LOZENGE | OROMUCOSAL | Status: DC | PRN

## 2015-10-04 MED ORDER — DEXAMETHASONE SODIUM PHOSPHATE 10 MG/ML IJ SOLN
INTRAMUSCULAR | Status: AC
  Filled 2015-10-04: qty 1

## 2015-10-04 MED ORDER — SODIUM CHLORIDE 0.9 % IJ SOLN
INTRAMUSCULAR | Status: DC | PRN
  Administered 2015-10-04: 20 mL

## 2015-10-04 MED ORDER — LEVOTHYROXINE SODIUM 25 MCG PO TABS
25.0000 ug | ORAL_TABLET | Freq: Every day | ORAL | Status: DC
  Administered 2015-10-05 – 2015-10-06 (×2): 25 ug via ORAL
  Filled 2015-10-04 (×3): qty 1

## 2015-10-04 MED ORDER — DEXAMETHASONE SODIUM PHOSPHATE 10 MG/ML IJ SOLN
INTRAMUSCULAR | Status: DC | PRN
  Administered 2015-10-04: 10 mg via INTRAVENOUS

## 2015-10-04 MED ORDER — ONDANSETRON HCL 4 MG/2ML IJ SOLN
INTRAMUSCULAR | Status: AC
  Filled 2015-10-04: qty 2

## 2015-10-04 MED ORDER — MIDAZOLAM HCL 2 MG/2ML IJ SOLN
INTRAMUSCULAR | Status: AC
  Filled 2015-10-04: qty 2

## 2015-10-04 MED ORDER — POLYMYXIN B SULFATE 500000 UNITS IJ SOLR
INTRAMUSCULAR | Status: AC
  Filled 2015-10-04: qty 1

## 2015-10-04 MED ORDER — BUPIVACAINE LIPOSOME 1.3 % IJ SUSP
INTRAMUSCULAR | Status: DC | PRN
  Administered 2015-10-04: 20 mL

## 2015-10-04 MED ORDER — THROMBIN 5000 UNITS EX SOLR
CUTANEOUS | Status: AC
  Filled 2015-10-04: qty 5000

## 2015-10-04 MED ORDER — ALUM & MAG HYDROXIDE-SIMETH 200-200-20 MG/5ML PO SUSP: 30.0000 mL | ORAL | Status: DC | PRN

## 2015-10-04 MED ORDER — BUPIVACAINE HCL (PF) 0.25 % IJ SOLN
INTRAMUSCULAR | Status: DC | PRN
  Administered 2015-10-04: 20 mL

## 2015-10-04 MED ORDER — TRANEXAMIC ACID 1000 MG/10ML IV SOLN
1000.0000 mg | INTRAVENOUS | Status: AC
  Administered 2015-10-04: 1000 mg via INTRAVENOUS
  Filled 2015-10-04: qty 10

## 2015-10-04 MED ORDER — FENTANYL CITRATE (PF) 100 MCG/2ML IJ SOLN
INTRAMUSCULAR | Status: DC | PRN
  Administered 2015-10-04: 25 ug via INTRAVENOUS
  Administered 2015-10-04: 50 ug via INTRAVENOUS
  Administered 2015-10-04: 25 ug via INTRAVENOUS

## 2015-10-04 MED ORDER — BACITRACIN-NEOMYCIN-POLYMYXIN 400-5-5000 EX OINT
TOPICAL_OINTMENT | CUTANEOUS | Status: AC
  Filled 2015-10-04: qty 1

## 2015-10-04 MED ORDER — ACETAMINOPHEN 325 MG PO TABS: 650.0000 mg | ORAL_TABLET | Freq: Four times a day (QID) | ORAL | Status: DC | PRN

## 2015-10-04 MED ORDER — ACETAMINOPHEN 650 MG RE SUPP: 650.0000 mg | Freq: Four times a day (QID) | RECTAL | Status: DC | PRN

## 2015-10-04 MED ORDER — PHENYLEPHRINE HCL 10 MG/ML IJ SOLN
INTRAMUSCULAR | Status: DC | PRN
  Administered 2015-10-04: 40 ug via INTRAVENOUS

## 2015-10-04 MED ORDER — LIDOCAINE HCL (CARDIAC) 20 MG/ML IV SOLN
INTRAVENOUS | Status: AC
  Filled 2015-10-04: qty 5

## 2015-10-04 MED ORDER — ONDANSETRON HCL 4 MG/2ML IJ SOLN
INTRAMUSCULAR | Status: DC | PRN
  Administered 2015-10-04: 4 mg via INTRAVENOUS

## 2015-10-04 MED ORDER — MIDAZOLAM HCL 5 MG/5ML IJ SOLN
INTRAMUSCULAR | Status: DC | PRN
  Administered 2015-10-04 (×2): 1 mg via INTRAVENOUS

## 2015-10-04 MED ORDER — FERROUS SULFATE 325 (65 FE) MG PO TABS
325.0000 mg | ORAL_TABLET | Freq: Three times a day (TID) | ORAL | Status: DC
  Filled 2015-10-04 (×8): qty 1

## 2015-10-04 MED ORDER — SODIUM CHLORIDE 0.9 % IJ SOLN
INTRAMUSCULAR | Status: AC
  Filled 2015-10-04: qty 10

## 2015-10-04 MED ORDER — HYDROCODONE-ACETAMINOPHEN 5-325 MG PO TABS: 1.0000 | ORAL_TABLET | ORAL | Status: DC | PRN

## 2015-10-04 MED ORDER — CEFAZOLIN SODIUM-DEXTROSE 2-3 GM-% IV SOLR
INTRAVENOUS | Status: AC
  Filled 2015-10-04: qty 50

## 2015-10-04 MED ORDER — SODIUM CHLORIDE 0.9 % IR SOLN
Status: DC | PRN
  Administered 2015-10-04: 500 mL

## 2015-10-04 MED ORDER — CEFAZOLIN SODIUM-DEXTROSE 2-3 GM-% IV SOLR
2.0000 g | INTRAVENOUS | Status: AC
  Administered 2015-10-04: 2 g via INTRAVENOUS

## 2015-10-04 MED ORDER — CELECOXIB 200 MG PO CAPS
200.0000 mg | ORAL_CAPSULE | Freq: Two times a day (BID) | ORAL | Status: DC
  Administered 2015-10-04 – 2015-10-06 (×4): 200 mg via ORAL
  Filled 2015-10-04 (×7): qty 1

## 2015-10-04 MED ORDER — HYDROCHLOROTHIAZIDE 25 MG PO TABS
25.0000 mg | ORAL_TABLET | Freq: Every day | ORAL | Status: DC
  Administered 2015-10-04 – 2015-10-06 (×3): 25 mg via ORAL
  Filled 2015-10-04 (×3): qty 1

## 2015-10-04 MED ORDER — PHENYLEPHRINE 40 MCG/ML (10ML) SYRINGE FOR IV PUSH (FOR BLOOD PRESSURE SUPPORT)
PREFILLED_SYRINGE | INTRAVENOUS | Status: AC
  Filled 2015-10-04: qty 10

## 2015-10-04 MED ORDER — ALPRAZOLAM 0.25 MG PO TABS: 0.2500 mg | ORAL_TABLET | Freq: Four times a day (QID) | ORAL | Status: DC | PRN

## 2015-10-04 MED ORDER — BISACODYL 5 MG PO TBEC: 5.0000 mg | DELAYED_RELEASE_TABLET | Freq: Every day | ORAL | Status: DC | PRN

## 2015-10-04 MED ORDER — PANTOPRAZOLE SODIUM 40 MG PO TBEC
40.0000 mg | DELAYED_RELEASE_TABLET | Freq: Every day | ORAL | Status: DC
  Administered 2015-10-05 – 2015-10-06 (×2): 40 mg via ORAL
  Filled 2015-10-04 (×3): qty 1

## 2015-10-04 MED ORDER — ONDANSETRON HCL 4 MG/2ML IJ SOLN
4.0000 mg | Freq: Four times a day (QID) | INTRAMUSCULAR | Status: DC | PRN
  Administered 2015-10-04: 4 mg via INTRAVENOUS
  Filled 2015-10-04: qty 2

## 2015-10-04 MED ORDER — BUPIVACAINE LIPOSOME 1.3 % IJ SUSP
20.0000 mL | Freq: Once | INTRAMUSCULAR | Status: DC
  Filled 2015-10-04: qty 20

## 2015-10-04 MED ORDER — PHENOL 1.4 % MT LIQD: 1.0000 | OROMUCOSAL | Status: DC | PRN

## 2015-10-04 MED ORDER — EPHEDRINE SULFATE 50 MG/ML IJ SOLN
INTRAMUSCULAR | Status: DC | PRN
  Administered 2015-10-04 (×2): 5 mg via INTRAVENOUS

## 2015-10-04 MED ORDER — HYDROMORPHONE HCL 1 MG/ML IJ SOLN: 0.2500 mg | INTRAMUSCULAR | Status: DC | PRN

## 2015-10-04 MED ORDER — BUPIVACAINE HCL (PF) 0.25 % IJ SOLN
INTRAMUSCULAR | Status: AC
  Filled 2015-10-04: qty 30

## 2015-10-04 SURGICAL SUPPLY — 60 items
BAG DECANTER FOR FLEXI CONT (MISCELLANEOUS) IMPLANT
BAG ZIPLOCK 12X15 (MISCELLANEOUS) IMPLANT
BANDAGE ACE 4X5 VEL STRL LF (GAUZE/BANDAGES/DRESSINGS) ×2 IMPLANT
BANDAGE ACE 6X5 VEL STRL LF (GAUZE/BANDAGES/DRESSINGS) ×2 IMPLANT
BLADE SAG 18X100X1.27 (BLADE) ×2 IMPLANT
BLADE SAW SGTL 11.0X1.19X90.0M (BLADE) ×2 IMPLANT
BONE CEMENT GENTAMICIN (Cement) ×4 IMPLANT
CAP KNEE TOTAL 3 SIGMA ×2 IMPLANT
CEMENT BONE GENTAMICIN 40 (Cement) ×2 IMPLANT
CLOTH BEACON ORANGE TIMEOUT ST (SAFETY) ×2 IMPLANT
CUFF TOURN SGL QUICK 34 (TOURNIQUET CUFF) ×1
CUFF TRNQT CYL 34X4X40X1 (TOURNIQUET CUFF) ×1 IMPLANT
DECANTER SPIKE VIAL GLASS SM (MISCELLANEOUS) ×2 IMPLANT
DRAPE INCISE IOBAN 66X45 STRL (DRAPES) IMPLANT
DRAPE U-SHAPE 47X51 STRL (DRAPES) ×2 IMPLANT
DRSG ADAPTIC 3X8 NADH LF (GAUZE/BANDAGES/DRESSINGS) ×2 IMPLANT
DRSG PAD ABDOMINAL 8X10 ST (GAUZE/BANDAGES/DRESSINGS) ×2 IMPLANT
DURAPREP 26ML APPLICATOR (WOUND CARE) ×2 IMPLANT
ELECT REM PT RETURN 9FT ADLT (ELECTROSURGICAL) ×2
ELECTRODE REM PT RTRN 9FT ADLT (ELECTROSURGICAL) ×1 IMPLANT
EVACUATOR 1/8 PVC DRAIN (DRAIN) ×2 IMPLANT
GAUZE SPONGE 4X4 12PLY STRL (GAUZE/BANDAGES/DRESSINGS) ×2 IMPLANT
GLOVE BIOGEL PI IND STRL 6.5 (GLOVE) ×1 IMPLANT
GLOVE BIOGEL PI IND STRL 8 (GLOVE) ×1 IMPLANT
GLOVE BIOGEL PI INDICATOR 6.5 (GLOVE) ×1
GLOVE BIOGEL PI INDICATOR 8 (GLOVE) ×1
GLOVE ECLIPSE 8.0 STRL XLNG CF (GLOVE) ×4 IMPLANT
GLOVE SURG SS PI 6.5 STRL IVOR (GLOVE) ×2 IMPLANT
GOWN STRL REUS W/TWL LRG LVL3 (GOWN DISPOSABLE) ×2 IMPLANT
GOWN STRL REUS W/TWL XL LVL3 (GOWN DISPOSABLE) ×2 IMPLANT
HANDPIECE INTERPULSE COAX TIP (DISPOSABLE) ×1
IMMOBILIZER KNEE 20 (SOFTGOODS) ×2
IMMOBILIZER KNEE 20 THIGH 36 (SOFTGOODS) ×1 IMPLANT
LIQUID BAND (GAUZE/BANDAGES/DRESSINGS) ×2 IMPLANT
MANIFOLD NEPTUNE II (INSTRUMENTS) ×2 IMPLANT
NEEDLE HYPO 22GX1.5 SAFETY (NEEDLE) ×2 IMPLANT
NS IRRIG 1000ML POUR BTL (IV SOLUTION) IMPLANT
PACK TOTAL KNEE CUSTOM (KITS) ×2 IMPLANT
PADDING CAST COTTON 6X4 STRL (CAST SUPPLIES) ×4 IMPLANT
PENCIL SMOKE EVAC W/HOLSTER (ELECTROSURGICAL) ×2 IMPLANT
POSITIONER SURGICAL ARM (MISCELLANEOUS) ×2 IMPLANT
SET HNDPC FAN SPRY TIP SCT (DISPOSABLE) ×1 IMPLANT
SET PAD KNEE POSITIONER (MISCELLANEOUS) ×2 IMPLANT
SPONGE LAP 18X18 X RAY DECT (DISPOSABLE) IMPLANT
SPONGE SURGIFOAM ABS GEL 100 (HEMOSTASIS) ×2 IMPLANT
STRIP CLOSURE SKIN 1/2X4 (GAUZE/BANDAGES/DRESSINGS) ×2 IMPLANT
SUCTION FRAZIER 12FR DISP (SUCTIONS) ×2 IMPLANT
SUT BONE WAX W31G (SUTURE) ×2 IMPLANT
SUT MNCRL AB 4-0 PS2 18 (SUTURE) ×2 IMPLANT
SUT VIC AB 1 CT1 27 (SUTURE) ×2
SUT VIC AB 1 CT1 27XBRD ANTBC (SUTURE) ×2 IMPLANT
SUT VIC AB 2-0 CT1 27 (SUTURE) ×3
SUT VIC AB 2-0 CT1 TAPERPNT 27 (SUTURE) ×3 IMPLANT
SUT VLOC 180 0 24IN GS25 (SUTURE) IMPLANT
TOWER CARTRIDGE SMART MIX (DISPOSABLE) ×2 IMPLANT
TRAY FOLEY W/METER SILVER 14FR (SET/KITS/TRAYS/PACK) ×2 IMPLANT
TRAY FOLEY W/METER SILVER 16FR (SET/KITS/TRAYS/PACK) IMPLANT
WATER STERILE IRR 1500ML POUR (IV SOLUTION) ×2 IMPLANT
WRAP KNEE MAXI GEL POST OP (GAUZE/BANDAGES/DRESSINGS) ×2 IMPLANT
YANKAUER SUCT BULB TIP 10FT TU (MISCELLANEOUS) ×2 IMPLANT

## 2015-10-04 NOTE — Interval H&P Note (Signed)
History and Physical Interval Note:  10/04/2015 7:03 AM  Holly Callahan  has presented today for surgery, with the diagnosis of OA RIGHT KNEE   The various methods of treatment have been discussed with the patient and family. After consideration of risks, benefits and other options for treatment, the patient has consented to  Procedure(s): RIGHT TOTAL KNEE ARTHROPLASTY (Right) as a surgical intervention .  The patient's history has been reviewed, patient examined, no change in status, stable for surgery.  I have reviewed the patient's chart and labs.  Questions were answered to the patient's satisfaction.     Evey Mcmahan A

## 2015-10-04 NOTE — Op Note (Signed)
NAME:  Holly Callahan, Holly Callahan NO.:  [id number]  MEDICAL RECORD NO.:  [id number]  LOCATION:  [facility]                         FACILITY:  [facility]  PHYSICIAN:  Kipp Brood. Breunna Nordmann, M.D.DATE OF BIRTH:  13-Dec-1950  DATE OF PROCEDURE: DATE OF DISCHARGE:                              OPERATIVE REPORT   SURGEON:  Chanc Kervin A. Gladstone Lighter, MD  ASSISTANT:  Ardeen Jourdain, PA  PREOPERATIVE DIAGNOSIS:  Primary osteoarthritis with bone-on-bone, right knee.  POSTOPERATIVE DIAGNOSIS:  Primary osteoarthritis with bone-on-bone, right knee.  OPERATION:  Right total knee replacement.  SIZES USED:  Size 3 right posterior cruciate sacrificing femoral component, the tray was a 2.5.  The insert was a size 3, 10-mm thickness.  The patella was size 35 with 3 pegs.  Note, gentamicin was used in the cement, all 3 components were cemented.  DESCRIPTION OF PROCEDURE:  Under general anesthesia, routine orthopedic prep and draping of the right lower extremity was carried out. Appropriate time-out was first carried out.  I also marked the appropriate right leg in the holding area.  At this time, she had 2 g of IV Ancef and she had tranexamic acid as well.  The leg was exsanguinated and Esmarch tourniquet was elevated at 325 mmHg.  The leg then was placed in the [facility] knee holder and flexed and anterior approach of the knee was carried out.  Two flaps were created.  I then carried out a median parapatellar incision.  The medial and lateral meniscectomies were carried out.  I excised the anterior and posterior cruciate ligaments.  Note, she had rather significant arthritic changes.  The initial drill hole was made in the intercondylar notch.  The canal finder was inserted.  I then irrigated out the femoral canal.  Following this, I removed 11 mm of bone distally utilizing intramedullary guide. I then measured a femur to be a size 3.  I did anterior-posterior chamfering cuts for a size 3 right femoral  component.  Then, attention was directed to the tibial plateau.  The spinous processes with the oscillating saw first measured tibia to be a size 2.5.  At this time, initial drill hole was made in the tibia plateau and canal finder was inserted.  I then irrigated the canal.  At that particular time, we then went on and removed 4 mm thickness off the affected medial side of the tibia.  Following that, we then inserted our lamina spreaders, removed the posterior spurs from the femur.  I then utilized our spacer blocks, then we had a nice fit with a 10 mm thickness.  Following that, we then cut our keel, cut out of the tibial plateau.  I then did the notch cut out of the distal femur.  I then inserted my trial components.  We then did a resurfacing procedure on the patella.  Patella was severely arthritic.  We removed the spurs 1st and then removed the appropriate amount of bone on the resurfacing procedure.  We measured the patella to be a size 35.  Three drill holes were made in the articular surface of the patella.  Patella button then was inserted.  We had excellent fit. We then removed  all trial components, thoroughly water picked out the knee, dried the knee out, cemented all 3 components in simultaneously. We used gentamicin in the cement.  Once the cement was hardened, we removed loose pieces of cement, then water picked out the knee again to make sure there were no loose pieces of cement.  At this particular point, we then inserted our permanent insert 10 mm thickness size 3 rotating platform, reduced the knee and had excellent function, excellent stability.  At this time, I then injected 20 mL of 0.25% plain Marcaine in the medial side of the knee and then the surrounding soft tissue.  The patella was checked once again, it was fine.  There were no loose pieces of cement noted.  We then closed the knee in layers over Hemovac drain in usual fashion.  Sterile dressings were  applied.          ______________________________ Kipp Brood Gladstone Lighter, M.D.     RAG/MEDQ  D:  10/04/2015  T:  10/04/2015  Job:  [id number]

## 2015-10-04 NOTE — Progress Notes (Signed)
Utilization review completed.  

## 2015-10-04 NOTE — Progress Notes (Signed)
Dr. Delma Post in- made aware of patient's blood pressures and Aldrete scores

## 2015-10-04 NOTE — Evaluation (Signed)
Physical Therapy Evaluation Patient Details Name: MARYJAYNE BESTON MRN: [id number] DOB: 05/27/1951 Today's Date: 10/04/2015   History of Present Illness  RTKA  Clinical Impression  Patient tolerated ambulation well same day of surgery. Patient will benefit from PT while in acute care to improve functional mobility toDC to home.    Follow Up Recommendations Home health PT;Supervision/Assistance - 24 hour    Equipment Recommendations  Rolling walker with 5" wheels    Recommendations for Other Services       Precautions / Restrictions Precautions Precautions: Knee Required Braces or Orthoses: Knee Immobilizer - Right Knee Immobilizer - Right: Discontinue once straight leg raise with < 10 degree lag      Mobility  Bed Mobility Overal bed mobility: Needs Assistance Bed Mobility: Supine to Sit     Supine to sit: Min assist     General bed mobility comments: support R leg  Transfers Overall transfer level: Needs assistance Equipment used: Rolling walker (2 wheeled) Transfers: Sit to/from Stand Sit to Stand: Min assist         General transfer comment: cues for hand and R LEG POSITION  Ambulation/Gait Ambulation/Gait assistance: Min assist Ambulation Distance (Feet): 10 Feet Assistive device: Rolling walker (2 wheeled) Gait Pattern/deviations: Step-to pattern;Decreased step length - right;Decreased stance time - right     General Gait Details: cues for sequence.  Stairs            Wheelchair Mobility    Modified Rankin (Stroke Patients Only)       Balance                                             Pertinent Vitals/Pain Pain Assessment: 0-10 Pain Score: 5  Pain Location: R knee, felt better with moving it around, flexing. Pain Descriptors / Indicators: Discomfort;Grimacing Pain Intervention(s): Monitored during session;Premedicated before session;Repositioned;Ice applied    Home Living Family/patient expects to be  discharged to:: Private residence Living Arrangements: Spouse/significant other;Children Available Help at Discharge: Family Type of Home: House Home Access: Stairs to enter Entrance Stairs-Rails: Psychiatric nurse of Steps: 5 Home Layout: Two level;Able to live on main level with bedroom/bathroom Home Equipment: None      Prior Function Level of Independence: Independent               Hand Dominance        Extremity/Trunk Assessment   Upper Extremity Assessment: Defer to OT evaluation           Lower Extremity Assessment: RLE deficits/detail RLE Deficits / Details: able to perform SLR    Cervical / Trunk Assessment: Normal  Communication   Communication: No difficulties  Cognition Arousal/Alertness: Awake/alert Behavior During Therapy: WFL for tasks assessed/performed Overall Cognitive Status: Within Functional Limits for tasks assessed                      General Comments      Exercises Total Joint Exercises Ankle Circles/Pumps: AROM;Both;10 reps Heel Slides: AAROM;Right;5 reps;Supine Straight Leg Raises: AAROM;Right;5 reps;Supine      Assessment/Plan    PT Assessment Patient needs continued PT services  PT Diagnosis Difficulty walking;Acute pain   PT Problem List Decreased strength;Decreased range of motion;Decreased activity tolerance;Decreased balance;Decreased mobility;Decreased knowledge of use of DME;Decreased safety awareness;Pain;Decreased knowledge of precautions  PT Treatment Interventions DME instruction;Gait training;Stair training;Functional mobility training;Therapeutic activities;Therapeutic  exercise;Patient/family education   PT Goals (Current goals can be found in the Care Plan section) Acute Rehab PT Goals Patient Stated Goal: to work in the yard. PT Goal Formulation: With patient/family Time For Goal Achievement: 10/11/15 Potential to Achieve Goals: Good    Frequency 7X/week   Barriers to discharge         Co-evaluation               End of Session Equipment Utilized During Treatment: Right knee immobilizer Activity Tolerance: Patient tolerated treatment well Patient left: in chair;with call bell/phone within reach;with chair alarm set;with family/visitor present Nurse Communication: Mobility status         Time: [id number] PT Time Calculation (min) (ACUTE ONLY): 25 min   Charges:   PT Evaluation $Initial PT Evaluation Tier I: 1 Procedure PT Treatments $Gait Training: 8-22 mins   PT G Codes:        Claretha Cooper 10/04/2015, 4:58 PM Tresa Endo PT [phone]

## 2015-10-04 NOTE — Anesthesia Procedure Notes (Addendum)
Spinal Patient location during procedure: OR Start time: 10/04/2015 7:20 AM End time: 10/04/2015 7:26 AM Staffing Resident/CRNA: Carleene Cooper A Performed by: resident/CRNA  Preanesthetic Checklist Completed: patient identified, site marked, surgical consent, pre-op evaluation, timeout performed, IV checked, risks and benefits discussed and monitors and equipment checked Spinal Block Patient position: sitting Prep: Betadine Patient monitoring: heart rate Approach: midline Location: L2-3 Injection technique: single-shot Needle Needle type: Spinocan  Needle gauge: 22 G Needle length: 9 cm Assessment Sensory level: T4 Additional Notes Pt placed in sitting position for spinal placement. Pt tolerated well. Spinal kit expiration date checked. One attempt by CRNA. + CSF, -heme.  Procedure Name: MAC Date/Time: 10/04/2015 7:18 AM Performed by: Carleene Cooper A Pre-anesthesia Checklist: Patient identified, Timeout performed, Emergency Drugs available, Suction available and Patient being monitored Patient Re-evaluated:Patient Re-evaluated prior to inductionOxygen Delivery Method: Simple face mask Dental Injury: Teeth and Oropharynx as per pre-operative assessment

## 2015-10-04 NOTE — Brief Op Note (Signed)
10/04/2015  9:12 AM  PATIENT:  Burt Knack  64 y.o. female  PRE-OPERATIVE DIAGNOSIS:Primary  OA RIGHT KNEE   POST-OPERATIVE DIAGNOSIS:Primary  Osteoarthritis right knee  PROCEDURE:  Procedure(s): RIGHT TOTAL KNEE ARTHROPLASTY (Right)  SURGEON:  Surgeon(s) and Role:    * Latanya Maudlin, MD - Primary  PHYSICIAN ASSISTANT: Ardeen Jourdain PA  ASSISTANTS:Amber Jackson PA  ANESTHESIA:   general  EBL:  Total I/O In: 1000 [I.V.:1000] Out: 350 [Urine:300; Blood:50]  BLOOD ADMINISTERED:none  DRAINS: (One) Hemovact drain(s) in the Right Knee with  Suction Open   LOCAL MEDICATIONS USED:  MARCAINE 20cc of 0.25% Plain and 20cc of Exparel with 20cc of Normal Saline.   SPECIMEN:  No Specimen  DISPOSITION OF SPECIMEN:  N/A  COUNTS:  YES  TOURNIQUET:   Total Tourniquet Time Documented: Thigh (Right) - 80 minutes Total: Thigh (Right) - 80 minutes   DICTATION: .Other Dictation: Dictation Number [id number]  PLAN OF CARE: Admit to inpatient   PATIENT DISPOSITION:  Stable in OR   Delay start of Pharmacological VTE agent (>24hrs) due to surgical blood loss or risk of bleeding: yes

## 2015-10-04 NOTE — Transfer of Care (Signed)
Immediate Anesthesia Transfer of Care Note  Patient: Holly Callahan  Procedure(s) Performed: Procedure(s): RIGHT TOTAL KNEE ARTHROPLASTY (Right)  Patient Location: PACU  Anesthesia Type:Spinal  Level of Consciousness: awake, alert , oriented and patient cooperative  Airway & Oxygen Therapy: Patient Spontanous Breathing and Patient connected to face mask oxygen  Post-op Assessment: Report given to RN and Post -op Vital signs reviewed and stable  Post vital signs: Reviewed and stable  Last Vitals:  Filed Vitals:   10/04/15 0519  BP: 166/83  Pulse: 65  Temp: 36.5 C  Resp: 16    Complications: No apparent anesthesia complications

## 2015-10-04 NOTE — Anesthesia Postprocedure Evaluation (Addendum)
Anesthesia Post Note  Patient: Holly Callahan  Procedure(s) Performed: Procedure(s) (LRB): RIGHT TOTAL KNEE ARTHROPLASTY (Right)  Patient location during evaluation: PACU Anesthesia Type: Spinal Level of consciousness: oriented and awake and alert Pain management: pain level controlled Vital Signs Assessment: post-procedure vital signs reviewed and stable Respiratory status: spontaneous breathing, respiratory function stable and patient connected to nasal cannula oxygen Cardiovascular status: blood pressure returned to baseline and stable Postop Assessment: no headache, no backache and spinal receding Anesthetic complications: no    Last Vitals:  Filed Vitals:   10/04/15 1300 10/04/15 1400  BP: 171/95 163/87  Pulse: 64 66  Temp: 36.5 C 36.7 C  Resp: 16 16    Last Pain:  Filed Vitals:   10/04/15 1443  PainSc: Asleep                 Eilyn Polack J

## 2015-10-05 LAB — BASIC METABOLIC PANEL
ANION GAP: 7 (ref 5–15)
BUN: 18 mg/dL (ref 6–20)
CO2: 28 mmol/L (ref 22–32)
Calcium: 8.8 mg/dL — ABNORMAL LOW (ref 8.9–10.3)
Chloride: 104 mmol/L (ref 101–111)
Creatinine, Ser: 0.9 mg/dL (ref 0.44–1.00)
GFR calc Af Amer: >60 mL/min (ref >60)
GLUCOSE: 136 mg/dL — AB (ref 65–99)
POTASSIUM: 3.6 mmol/L (ref 3.5–5.1)
Sodium: 139 mmol/L (ref 135–145)

## 2015-10-05 LAB — CBC
HEMATOCRIT: 31.5 % — AB (ref 36.0–46.0)
Hemoglobin: 10.7 g/dL — ABNORMAL LOW (ref 12.0–15.0)
MCH: 30.1 pg (ref 26.0–34.0)
MCHC: 34 g/dL (ref 30.0–36.0)
MCV: 88.5 fL (ref 78.0–100.0)
PLATELETS: 274 10*3/uL (ref 150–400)
RBC: 3.56 MIL/uL — AB (ref 3.87–5.11)
RDW: 12.6 % (ref 11.5–15.5)
WBC: 11.7 10*3/uL — AB (ref 4.0–10.5)

## 2015-10-05 MED ORDER — METHOCARBAMOL 500 MG PO TABS: 500.0000 mg | ORAL_TABLET | Freq: Four times a day (QID) | ORAL | Status: DC | PRN

## 2015-10-05 MED ORDER — OXYCODONE-ACETAMINOPHEN 5-325 MG PO TABS: 1.0000 | ORAL_TABLET | ORAL | Status: DC | PRN

## 2015-10-05 MED ORDER — PROMETHAZINE HCL 12.5 MG PO TABS: 12.5000 mg | ORAL_TABLET | Freq: Four times a day (QID) | ORAL | Status: DC | PRN

## 2015-10-05 MED ORDER — DOCUSATE SODIUM 100 MG PO CAPS
100.0000 mg | ORAL_CAPSULE | Freq: Two times a day (BID) | ORAL | Status: DC
  Administered 2015-10-05 – 2015-10-06 (×3): 100 mg via ORAL

## 2015-10-05 NOTE — Discharge Instructions (Signed)
INSTRUCTIONS AFTER JOINT REPLACEMENT   o Remove items at home which could result in a fall. This includes throw rugs or furniture in walking pathways o ICE to the affected joint every three hours while awake for 30 minutes at a time, for at least the first 3-5 days, and then as needed for pain and swelling.  Continue to use ice for pain and swelling. You may notice swelling that will progress down to the foot and ankle.  This is normal after surgery.  Elevate your leg when you are not up walking on it.   o Continue to use the breathing machine you got in the hospital (incentive spirometer) which will help keep your temperature down.  It is common for your temperature to cycle up and down following surgery, especially at night when you are not up moving around and exerting yourself.  The breathing machine keeps your lungs expanded and your temperature down.   DIET:  As you were doing prior to hospitalization, we recommend a well-balanced diet.  DRESSING / WOUND CARE / SHOWERING  You may change the dressing every day with sterile gauze.  Please use good hand washing techniques before changing the dressing.  Do not use any lotions or creams on the incision until instructed by your surgeon.  ACTIVITY  o Increase activity slowly as tolerated, but follow the weight bearing instructions below.   o No driving for 6 weeks or until further direction given by your physician.  You cannot drive while taking narcotics.  o No lifting or carrying greater than 10 lbs. until further directed by your surgeon. o Avoid periods of inactivity such as sitting longer than an hour when not asleep. This helps prevent blood clots.  o You may return to work once you are authorized by your doctor.     WEIGHT BEARING   Weight bearing as tolerated with assist device (walker, cane, etc) as directed, use it as long as suggested by your surgeon or therapist, typically at least 4-6 weeks.   EXERCISES  Results after joint  replacement surgery are often greatly improved when you follow the exercise, range of motion and muscle strengthening exercises prescribed by your doctor. Safety measures are also important to protect the joint from further injury. Any time any of these exercises cause you to have increased pain or swelling, decrease what you are doing until you are comfortable again and then slowly increase them. If you have problems or questions, call your caregiver or physical therapist for advice.   Rehabilitation is important following a joint replacement. After just a few days of immobilization, the muscles of the leg can become weakened and shrink (atrophy).  These exercises are designed to build up the tone and strength of the thigh and leg muscles and to improve motion. Often times heat used for twenty to thirty minutes before working out will loosen up your tissues and help with improving the range of motion but do not use heat for the first two weeks following surgery (sometimes heat can increase post-operative swelling).   These exercises can be done on a training (exercise) mat, on the floor, on a table or on a bed. Use whatever works the best and is most comfortable for you.    Use music or television while you are exercising so that the exercises are a pleasant break in your day. This will make your life better with the exercises acting as a break in your routine that you can look forward to.  Perform all exercises about fifteen times, three times per day or as directed.  You should exercise both the operative leg and the other leg as well.  Exercises include:    Quad Sets - Tighten up the muscle on the front of the thigh (Quad) and hold for 5-10 seconds.    Straight Leg Raises - With your knee straight (if you were given a brace, keep it on), lift the leg to 60 degrees, hold for 3 seconds, and slowly lower the leg.  Perform this exercise against resistance later as your leg gets stronger.   Leg Slides:  Lying on your back, slowly slide your foot toward your buttocks, bending your knee up off the floor (only go as far as is comfortable). Then slowly slide your foot back down until your leg is flat on the floor again.   Angel Wings: Lying on your back spread your legs to the side as far apart as you can without causing discomfort.   Hamstring Strength:  Lying on your back, push your heel against the floor with your leg straight by tightening up the muscles of your buttocks.  Repeat, but this time bend your knee to a comfortable angle, and push your heel against the floor.  You may put a pillow under the heel to make it more comfortable if necessary.   A rehabilitation program following joint replacement surgery can speed recovery and prevent re-injury in the future due to weakened muscles. Contact your doctor or a physical therapist for more information on knee rehabilitation.    CONSTIPATION  Constipation is defined medically as fewer than three stools per week and severe constipation as less than one stool per week.  Even if you have a regular bowel pattern at home, your normal regimen is likely to be disrupted due to multiple reasons following surgery.  Combination of anesthesia, postoperative narcotics, change in appetite and fluid intake all can affect your bowels.   YOU MUST use at least one of the following options; they are listed in order of increasing strength to get the job done.  They are all available over the counter, and you may need to use some, POSSIBLY even all of these options:    Drink plenty of fluids (prune juice may be helpful) and high fiber foods Colace 100 mg by mouth twice a day  Senokot for constipation as directed and as needed Dulcolax (bisacodyl), take with full glass of water  Miralax (polyethylene glycol) once or twice a day as needed.  If you have tried all these things and are unable to have a bowel movement in the first 3-4 days after surgery call either your  surgeon or your primary doctor.    If you experience loose stools or diarrhea, hold the medications until you stool forms back up.  If your symptoms do not get better within 1 week or if they get worse, check with your doctor.  If you experience "the worst abdominal pain ever" or develop nausea or vomiting, please contact the office immediately for further recommendations for treatment.   ITCHING:  If you experience itching with your medications, try taking only a single pain pill, or even half a pain pill at a time.  You can also use Benadryl over the counter for itching or also to help with sleep.   TED HOSE STOCKINGS:  Use stockings on both legs until for at least 2 weeks or as directed by physician office. They may be removed at night for  sleeping.  MEDICATIONS:  See your medication summary on the After Visit Summary that nursing will review with you.  You may have some home medications which will be placed on hold until you complete the course of blood thinner medication.  It is important for you to complete the blood thinner medication as prescribed. Take aspirin 325mg  twice daily to prevent blood clots.  PRECAUTIONS:  If you experience chest pain or shortness of breath - call 911 immediately for transfer to the hospital emergency department.   If you develop a fever greater that 101 F, purulent drainage from wound, increased redness or drainage from wound, foul odor from the wound/dressing, or calf pain - CONTACT YOUR SURGEON.                                                   FOLLOW-UP APPOINTMENTS:  If you do not already have a post-op appointment, please call the office for an appointment to be seen by your surgeon.  Guidelines for how soon to be seen are listed in your After Visit Summary, but are typically between 1-4 weeks after surgery.    MAKE SURE YOU:   Understand these instructions.   Get help right away if you are not doing well or get worse.    Thank you for letting  us be a part of your medical care team.  It is a privilege we respect greatly.  We hope these instructions will help you stay on track for a fast and full recovery!

## 2015-10-05 NOTE — Progress Notes (Signed)
Physical Therapy Treatment Patient Details Name: Holly Callahan MRN: [id number] DOB: 07/11/1951 Today's Date: 10/05/2015    History of Present Illness RTKA    PT Comments    POD # 1 am session.  Applied KI and isntructed on use for long gait distances and stairs.  Assisted with amb a greater distance in hallway then returned to room to perform TKR TE's following HEP handout.  Instructed on proper tech and freq followed by use of ICE after.    Follow Up Recommendations  Home health PT;Supervision/Assistance - 24 hour     Equipment Recommendations  Rolling walker with 5" wheels    Recommendations for Other Services       Precautions / Restrictions Precautions Precautions: Fall;Knee Precaution Comments: instructed on KI use for long amb and stairs Required Braces or Orthoses: Knee Immobilizer - Right Knee Immobilizer - Right: Discontinue once straight leg raise with < 10 degree lag Restrictions Weight Bearing Restrictions: No RLE Weight Bearing: Weight bearing as tolerated    Mobility  Bed Mobility               General bed mobility comments: Pt OOB in recliner  Transfers Overall transfer level: Needs assistance Equipment used: Rolling walker (2 wheeled) Transfers: Sit to/from Stand Sit to Stand: Supervision         General transfer comment: 25% VC's on proper tech and hand placement plus increased time  Ambulation/Gait Ambulation/Gait assistance: Min guard;Min assist Ambulation Distance (Feet): 55 Feet Assistive device: Rolling walker (2 wheeled) Gait Pattern/deviations: Step-to pattern;Decreased step length - right;Decreased stance time - right;Trunk flexed Gait velocity: decreased   General Gait Details: 25% VC's on proper sequencing and proper walker to self distance.   Stairs            Wheelchair Mobility    Modified Rankin (Stroke Patients Only)       Balance                                    Cognition  Arousal/Alertness: Awake/alert Behavior During Therapy: WFL for tasks assessed/performed Overall Cognitive Status: Within Functional Limits for tasks assessed                      Exercises   Total Knee Replacement TE's 10 reps B LE ankle pumps 10 reps towel squeezes 10 reps knee presses 10 reps heel slides  10 reps SAQ's 10 reps SLR's 10 reps ABD Followed by ICE     General Comments        Pertinent Vitals/Pain Pain Assessment: 0-10 Pain Score: 5  Pain Location: R knee Pain Descriptors / Indicators: Discomfort;Sore Pain Intervention(s): Monitored during session;Premedicated before session;Repositioned;Ice applied    Home Living                      Prior Function            PT Goals (current goals can now be found in the care plan section) Progress towards PT goals: Progressing toward goals    Frequency  7X/week    PT Plan      Co-evaluation             End of Session Equipment Utilized During Treatment: Right knee immobilizer Activity Tolerance: Patient tolerated treatment well Patient left: in chair;with call bell/phone within reach;with chair alarm set;with family/visitor present     Time:  [id number] PT Time Calculation (min) (ACUTE ONLY): 41 min  Charges:  $Gait Training: 8-22 mins $Therapeutic Exercise: 8-22 mins $Therapeutic Activity: 8-22 mins                    G Codes:      Rica Koyanagi  PTA [facility]  Acute  Rehab Pager      [phone]

## 2015-10-05 NOTE — Care Management Note (Signed)
Case Management Note  Patient Details  Name: [name] MRN: [id number] Date of Birth: Oct 27, 1950  Subjective/Objective:      S/p Right total knee replacement              Action/Plan: Discharge planning, spoke with patient and family at bedside. Have chosen [facility] for [facility] PT. Contacted [facility] for referral. Has all needed DME at home.   Expected Discharge Date:                  Expected Discharge Plan:  [place]  In-House Referral:  NA  Discharge planning Services  CM Consult  Post Acute Care Choice:  Home Health Choice offered to:  Patient  DME Arranged:  N/A DME Agency:  NA  HH Arranged:  PT HH Agency:  [facility]  Status of Service:  Completed, signed off  Medicare Important Message Given:    Date Medicare IM Given:    Medicare IM give by:    Date Additional Medicare IM Given:    Additional Medicare Important Message give by:     If discussed at [place] of Stay Meetings, dates discussed:    Additional Comments:  Guadalupe Maple, RN 10/05/2015, 2:03 PM

## 2015-10-05 NOTE — Progress Notes (Signed)
Physical Therapy Treatment Patient Details Name: Holly Callahan MRN: [id number] DOB: 1951-05-10 Today's Date: 10/05/2015    History of Present Illness RTKA    PT Comments    POD # 1 pm session.  Applied KI and assisted with amb a greater distance in hallway.  Assisted back to bed per pt request.  Applied ICE.   Follow Up Recommendations  Home health PT;Supervision/Assistance - 24 hour     Equipment Recommendations  Rolling walker with 5" wheels    Recommendations for Other Services       Precautions / Restrictions Precautions Precautions: Fall;Knee Precaution Comments: instructed on KI use for long amb and stairs Required Braces or Orthoses: Knee Immobilizer - Right Knee Immobilizer - Right: Discontinue once straight leg raise with < 10 degree lag Restrictions Weight Bearing Restrictions: No RLE Weight Bearing: Weight bearing as tolerated    Mobility  Bed Mobility Overal bed mobility: Needs Assistance Bed Mobility: Sit to Supine       Sit to supine: Min assist   General bed mobility comments: assist back to bed  Transfers Overall transfer level: Needs assistance Equipment used: Rolling walker (2 wheeled) Transfers: Sit to/from Stand Sit to Stand: Supervision         General transfer comment: 25% VC's on proper tech and hand placement plus increased time  Ambulation/Gait Ambulation/Gait assistance: Min guard;Min assist Ambulation Distance (Feet): 75 Feet Assistive device: Rolling walker (2 wheeled) Gait Pattern/deviations: Step-to pattern;Decreased step length - right;Decreased stance time - right;Trunk flexed Gait velocity: decreased   General Gait Details: 25% VC's on proper sequencing and proper walker to self distance.   Stairs            Wheelchair Mobility    Modified Rankin (Stroke Patients Only)       Balance                                    Cognition Arousal/Alertness: Awake/alert Behavior During Therapy:  WFL for tasks assessed/performed Overall Cognitive Status: Within Functional Limits for tasks assessed                      Exercises      General Comments        Pertinent Vitals/Pain Pain Assessment: 0-10 Pain Score: 5  Pain Location: R knee Pain Descriptors / Indicators: Discomfort;Sore Pain Intervention(s): Monitored during session;Premedicated before session;Repositioned;Ice applied    Home Living                      Prior Function            PT Goals (current goals can now be found in the care plan section) Progress towards PT goals: Progressing toward goals    Frequency  7X/week    PT Plan      Co-evaluation             End of Session Equipment Utilized During Treatment: Right knee immobilizer Activity Tolerance: Patient tolerated treatment well Patient left: in chair;with call bell/phone within reach;with chair alarm set;with family/visitor present     Time: 1430-1500 PT Time Calculation (min) (ACUTE ONLY): 30 min  Charges:  1 gt                   1 ta  G Codes:      Rica Koyanagi  PTA [facility]  Acute  Rehab Pager      [phone]

## 2015-10-05 NOTE — Evaluation (Signed)
Occupational Therapy Evaluation Patient Details Name: Holly Callahan MRN: [id number] DOB: [birth year] Today's Date: 10/05/2015    History of Present Illness RTKA   Clinical Impression   Patient admitted with above. Patient independent PTA. Patient currently functioning at an overall supervision level.  No additional OT needs identified, D/C from acute OT services and no additional follow-up OT needs at this time. Plan is for patient to discharge home tomorrow. All appropriate education provided to patient. Please re-order OT if needed.      Follow Up Recommendations  No OT follow up;Supervision - Intermittent    Equipment Recommendations  None recommended by OT    Recommendations for Other Services  None at this time    Precautions / Restrictions Precautions Precautions: Knee Required Braces or Orthoses: Knee Immobilizer - Right Knee Immobilizer - Right: Discontinue once straight leg raise with < 10 degree lag Restrictions Weight Bearing Restrictions: Yes RLE Weight Bearing: Weight bearing as tolerated    Mobility Bed Mobility General bed mobility comments: Pt found seated in recliner upon OT entering/exiting room, see PT eval for more information  Transfers Overall transfer level: Needs assistance Equipment used: Rolling walker (2 wheeled) Transfers: Sit to/from Stand Sit to Stand: Supervision General transfer comment: Supervision for safety. Pt with good carryover from previous PT eval.    Balance Overall balance assessment: Needs assistance Sitting-balance support: No upper extremity supported;Feet supported Sitting balance-Leahy Scale: Good     Standing balance support: Bilateral upper extremity supported;During functional activity Standing balance-Leahy Scale: Good    ADL Overall ADL's : Needs assistance/impaired General ADL Comments: Pt overall supervision for ADLs and functional mobility. Educated pt on tub/shower transfer with handout given and toilet  transfer. Pt able to reach BLEs for LB ADLs. Pt states someone will be with her 24/7.     Pertinent Vitals/Pain Pain Assessment: Faces Faces Pain Scale: Hurts a little bit Pain Location: right knee Pain Descriptors / Indicators: Discomfort;Guarding Pain Intervention(s): Monitored during session;Repositioned     Hand Dominance Right   Extremity/Trunk Assessment Upper Extremity Assessment Upper Extremity Assessment: Overall WFL for tasks assessed   Lower Extremity Assessment Lower Extremity Assessment: Defer to PT evaluation   Cervical / Trunk Assessment Cervical / Trunk Assessment: Normal   Communication Communication Communication: No difficulties   Cognition Arousal/Alertness: Awake/alert Behavior During Therapy: WFL for tasks assessed/performed Overall Cognitive Status: Within Functional Limits for tasks assessed              Home Living Family/patient expects to be discharged to:: Private residence Living Arrangements: Spouse/significant other;Children Available Help at Discharge: Family Type of Home: House Home Access: Stairs to enter Technical brewer of Steps: 5 Entrance Stairs-Rails: Right;Left Home Layout: Two level;Able to live on main level with bedroom/bathroom     Bathroom Shower/Tub: Tub/shower unit;Curtain   Bathroom Toilet: Standard     Home Equipment: Bedside commode;Shower seat   Prior Functioning/Environment Level of Independence: Independent     OT Diagnosis: Generalized weakness;Acute pain   OT Problem List:   N/a, no acute OT needs identified at this time     OT Treatment/Interventions:   N/a, no acute OT needs identified at this time     OT Goals(Current goals can be found in the care plan section) Acute Rehab OT Goals Patient Stated Goal: go home tomorrow OT Goal Formulation: All assessment and education complete, DC therapy  OT Frequency:   N/a, no acute OT needs identified at this time     Barriers to D/C:  None known at  this time    End of Session Equipment Utilized During Treatment: Rolling walker;Right knee immobilizer  Activity Tolerance: Patient tolerated treatment well Patient left: in chair;with call bell/phone within reach;with family/visitor present;with chair alarm set   Time: 1047-1107 OT Time Calculation (min): 20 min Charges:  OT General Charges $OT Visit: 1 Procedure OT Evaluation $Initial OT Evaluation Tier I: 1 Procedure  Chrys Racer , MS, OTR/L, CLT Pager: [phone]  10/05/2015, 11:12 AM

## 2015-10-05 NOTE — Progress Notes (Signed)
   Subjective: 1 Day Post-Op Procedure(s) (LRB): RIGHT TOTAL KNEE ARTHROPLASTY (Right) Patient reports pain as mild.   Patient seen in rounds with Dr. Gladstone Lighter. Patient is well, but has had some minor complaints of fever and nausea/vomiting. She had issues with nausea and vomiting yesterday evening but has improved at Phenergan. No SOB or chest pain. Reports that she walked with therapy yesterday.    Objective: Vital signs in last 24 hours: Temp:  [97.5 F (36.4 C)-98.5 F (36.9 C)] 98.5 F (36.9 C) (12/29 0443) Pulse Rate:  [53-76] 53 (12/29 0443) Resp:  [13-20] 16 (12/29 0443) BP: (109-171)/(71-95) 148/76 mmHg (12/29 0443) SpO2:  [94 %-99 %] 97 % (12/29 0443) Weight:  [70.761 kg (156 lb)] 70.761 kg (156 lb) (12/28 1059)  Intake/Output from previous day:  Intake/Output Summary (Last 24 hours) at 10/05/15 0714 Last data filed at 10/05/15 0650  Gross per 24 hour  Intake   4240 ml  Output   3251 ml  Net    989 ml     Labs:  Recent Labs  10/05/15 0441  HGB 10.7*    Recent Labs  10/05/15 0441  WBC 11.7*  RBC 3.56*  HCT 31.5*  PLT 274    Recent Labs  10/05/15 0441  NA 139  K 3.6  CL 104  CO2 28  BUN 18  CREATININE 0.90  GLUCOSE 136*  CALCIUM 8.8*    EXAM General - Patient is Alert and Oriented Extremity - Neurologically intact Intact pulses distally Dorsiflexion/Plantar flexion intact Incision: no drainage No cellulitis present Compartment soft Dressing - no drainage Motor Function - intact, moving foot and toes well on exam.  Hemovac pulled without difficulty.  Past Medical History  Diagnosis Date  . Hypertension   . Hypothyroidism   . PONV (postoperative nausea and vomiting)   . Numbness     hands bilat secondary to carpal tunnel   . Anxiety   . GERD (gastroesophageal reflux disease)   . Arthritis     Assessment/Plan: 1 Day Post-Op Procedure(s) (LRB): RIGHT TOTAL KNEE ARTHROPLASTY (Right) Active Problems:   History of total knee  arthroplasty  Estimated body mass index is 27.64 kg/(m^2) as calculated from the following:   Height as of this encounter: 5\' 3"  (1.6 m).   Weight as of this encounter: 70.761 kg (156 lb). Advance diet Up with therapy D/C IV fluids when tolerating POs well DVT Prophylaxis - Xarelto Weight-Bearing as tolerated   She is doing well. Will continue therapy today. Plan for DC home tomorrow with HHPT.   Ardeen Jourdain, PA-C Orthopaedic Surgery 10/05/2015, 7:14 AM

## 2015-10-06 LAB — CBC
HCT: 32.9 % — ABNORMAL LOW (ref 36.0–46.0)
Hemoglobin: 10.6 g/dL — ABNORMAL LOW (ref 12.0–15.0)
MCH: 29.6 pg (ref 26.0–34.0)
MCHC: 32.2 g/dL (ref 30.0–36.0)
MCV: 91.9 fL (ref 78.0–100.0)
PLATELETS: 269 10*3/uL (ref 150–400)
RBC: 3.58 MIL/uL — ABNORMAL LOW (ref 3.87–5.11)
RDW: 13.1 % (ref 11.5–15.5)
WBC: 9.3 10*3/uL (ref 4.0–10.5)

## 2015-10-06 LAB — BASIC METABOLIC PANEL
ANION GAP: 7 (ref 5–15)
BUN: 20 mg/dL (ref 6–20)
CALCIUM: 8.9 mg/dL (ref 8.9–10.3)
CO2: 29 mmol/L (ref 22–32)
CREATININE: 1.05 mg/dL — AB (ref 0.44–1.00)
Chloride: 105 mmol/L (ref 101–111)
GFR, EST NON AFRICAN AMERICAN: 55 mL/min — AB (ref >60)
Glucose, Bld: 114 mg/dL — ABNORMAL HIGH (ref 65–99)
Potassium: 3.9 mmol/L (ref 3.5–5.1)
Sodium: 141 mmol/L (ref 135–145)

## 2015-10-06 NOTE — Progress Notes (Signed)
Patient ID: Holly Callahan, female   DOB: [birth year], 64 y.o.   MRN: [id number]

## 2015-10-06 NOTE — Progress Notes (Signed)
Subjective: 2 Days Post-Op Procedure(s) (LRB): RIGHT TOTAL KNEE ARTHROPLASTY (Right) Patient reports pain as 2 on 0-10 scale.Doing well today. Will DC    Objective: Vital signs in last 24 hours: Temp:  [98.1 F (36.7 C)-98.7 F (37.1 C)] 98.5 F (36.9 C) (12/30 0457) Pulse Rate:  [56-63] 56 (12/30 0457) Resp:  [17-18] 17 (12/30 0457) BP: (126-141)/(64-69) 131/69 mmHg (12/30 0457) SpO2:  [95 %-100 %] 97 % (12/30 0457)  Intake/Output from previous day: 12/29 0701 - 12/30 0700 In: 720 [P.O.:720] Out: 950 [Urine:950] Intake/Output this shift:     Recent Labs  10/05/15 0441 10/06/15 0450  HGB 10.7* 10.6*    Recent Labs  10/05/15 0441 10/06/15 0450  WBC 11.7* 9.3  RBC 3.56* 3.58*  HCT 31.5* 32.9*  PLT 274 269    Recent Labs  10/05/15 0441 10/06/15 0450  NA 139 141  K 3.6 3.9  CL 104 105  CO2 28 29  BUN 18 20  CREATININE 0.90 1.05*  GLUCOSE 136* 114*  CALCIUM 8.8* 8.9   No results for input(s): LABPT, INR in the last 72 hours.  Dorsiflexion/Plantar flexion intact  Assessment/Plan: 2 Days Post-Op Procedure(s) (LRB): RIGHT TOTAL KNEE ARTHROPLASTY (Right) Up with therapy Discharge home with home health.Start Aspirin Today as Instructed.  Holly Callahan A 10/06/2015, 8:14 AM

## 2015-10-06 NOTE — Progress Notes (Signed)
Physical Therapy Treatment Patient Details Name: Holly Callahan MRN: [id number] DOB: [birth year] Today's Date: 10/16/15    History of Present Illness RTKA    PT Comments    POD # 2 am session.  Pt dressed and eager to D/C to home.  Practiced stairs with spouse.  Given handout HEP and instructed on use of ICE.    Follow Up Recommendations  Home health PT;Supervision/Assistance - 24 hour     Equipment Recommendations  Rolling walker with 5" wheels    Recommendations for Other Services       Precautions / Restrictions Precautions Precautions: Fall;Knee Precaution Comments: instructed on KI use for long amb and stairs Required Braces or Orthoses: Knee Immobilizer - Right Knee Immobilizer - Right: Discontinue once straight leg raise with < 10 degree lag Restrictions Weight Bearing Restrictions: No RLE Weight Bearing: Weight bearing as tolerated    Mobility  Bed Mobility               General bed mobility comments: OOB in recliner  Transfers Overall transfer level: Needs assistance Equipment used: Rolling walker (2 wheeled) Transfers: Sit to/from Stand Sit to Stand: Supervision         General transfer comment: 25% VC's on proper tech and hand placement plus increased time  Ambulation/Gait Ambulation/Gait assistance: Min guard Ambulation Distance (Feet): 145 Feet Assistive device: Rolling walker (2 wheeled)       General Gait Details: 25% VC's on proper sequencing and proper walker to self distance.   Stairs Stairs: Yes Stairs assistance: Min assist Stair Management: One rail Right;Forwards Number of Stairs: 4 General stair comments: with spouse and 25% VC's on proper tech and sequencing.    Wheelchair Mobility    Modified Rankin (Stroke Patients Only)       Balance                                    Cognition Arousal/Alertness: Awake/alert Behavior During Therapy: WFL for tasks assessed/performed Overall Cognitive  Status: Within Functional Limits for tasks assessed                      Exercises      General Comments        Pertinent Vitals/Pain Pain Assessment: 0-10 Pain Score: 5  Pain Location: R knee Pain Descriptors / Indicators: Discomfort;Sore Pain Intervention(s): Monitored during session;Repositioned;Ice applied;Premedicated before session    Home Living                      Prior Function            PT Goals (current goals can now be found in the care plan section) Progress towards PT goals: Progressing toward goals    Frequency  7X/week    PT Plan      Co-evaluation             End of Session Equipment Utilized During Treatment: Right knee immobilizer Activity Tolerance: Patient tolerated treatment well Patient left: in chair;with call bell/phone within reach;with chair alarm set;with family/visitor present     Time: 0930-1000 PT Time Calculation (min) (ACUTE ONLY): 30 min  Charges:  $Gait Training: 8-22 mins $Therapeutic Activity: 8-22 mins                    G Codes:      Nathanial Rancher 10/16/15, 3:37 PM

## 2015-10-13 NOTE — Discharge Summary (Signed)
Physician Discharge Summary   Patient ID: Holly Callahan MRN: [id number] DOB/AGE: [birth year] 65 y.o.  Admit date: 10/04/2015 Discharge date: 10/06/2015  Primary Diagnosis: Primary osteoarthritis right knee  Admission Diagnoses:  Past Medical History  Diagnosis Date  . Hypertension   . Hypothyroidism   . PONV (postoperative nausea and vomiting)   . Numbness     hands bilat secondary to carpal tunnel   . Anxiety   . GERD (gastroesophageal reflux disease)   . Arthritis    Discharge Diagnoses:   Active Problems:   History of total knee arthroplasty  Estimated body mass index is 27.64 kg/(m^2) as calculated from the following:   Height as of this encounter: 5' 3"  (1.6 m).   Weight as of this encounter: 70.761 kg (156 lb).  Procedure:  Procedure(s) (LRB): RIGHT TOTAL KNEE ARTHROPLASTY (Right)   Consults: None  HPI: Holly Callahan, 65 y.o. female, has a history of pain and functional disability in the right knee due to arthritis and has failed non-surgical conservative treatments for greater than 12 weeks to includeNSAID's and/or analgesics, corticosteriod injections, flexibility and strengthening excercises and activity modification. Onset of symptoms was gradual, starting >10 years ago with gradually worsening course since that time. The patient noted no past surgery on the right knee(s). Patient currently rates pain in the right knee(s) at 7 out of 10 with activity. Patient has night pain, worsening of pain with activity and weight bearing, pain that interferes with activities of daily living, pain with passive range of motion, crepitus and joint swelling. Patient has evidence of periarticular osteophytes and joint space narrowing by imaging studies. There is no active infection.  Laboratory Data: Admission on 10/04/2015, Discharged on 10/06/2015  Component Date Value Ref Range Status  . WBC 10/05/2015 11.7* 4.0 - 10.5 K/uL Final  . RBC 10/05/2015 3.56* 3.87 - 5.11 MIL/uL  Final  . Hemoglobin 10/05/2015 10.7* 12.0 - 15.0 g/dL Final  . HCT 10/05/2015 31.5* 36.0 - 46.0 % Final  . MCV 10/05/2015 88.5  78.0 - 100.0 fL Final  . MCH 10/05/2015 30.1  26.0 - 34.0 pg Final  . MCHC 10/05/2015 34.0  30.0 - 36.0 g/dL Final  . RDW 10/05/2015 12.6  11.5 - 15.5 % Final  . Platelets 10/05/2015 274  150 - 400 K/uL Final  . Sodium 10/05/2015 139  135 - 145 mmol/L Final  . Potassium 10/05/2015 3.6  3.5 - 5.1 mmol/L Final  . Chloride 10/05/2015 104  101 - 111 mmol/L Final  . CO2 10/05/2015 28  22 - 32 mmol/L Final  . Glucose, Bld 10/05/2015 136* 65 - 99 mg/dL Final  . BUN 10/05/2015 18  6 - 20 mg/dL Final  . Creatinine, Ser 10/05/2015 0.90  0.44 - 1.00 mg/dL Final  . Calcium 10/05/2015 8.8* 8.9 - 10.3 mg/dL Final  . GFR calc non Af Amer 10/05/2015 >60  >60 mL/min Final  . GFR calc Af Amer 10/05/2015 >60  >60 mL/min Final   Comment: (NOTE) The eGFR has been calculated using the CKD EPI equation. This calculation has not been validated in all clinical situations. eGFR's persistently <60 mL/min signify possible Chronic Kidney Disease.   . Anion gap 10/05/2015 7  5 - 15 Final  . WBC 10/06/2015 9.3  4.0 - 10.5 K/uL Final  . RBC 10/06/2015 3.58* 3.87 - 5.11 MIL/uL Final  . Hemoglobin 10/06/2015 10.6* 12.0 - 15.0 g/dL Final  . HCT 10/06/2015 32.9* 36.0 - 46.0 % Final  . MCV 10/06/2015  91.9  78.0 - 100.0 fL Final  . MCH 10/06/2015 29.6  26.0 - 34.0 pg Final  . MCHC 10/06/2015 32.2  30.0 - 36.0 g/dL Final  . RDW 10/06/2015 13.1  11.5 - 15.5 % Final  . Platelets 10/06/2015 269  150 - 400 K/uL Final  . Sodium 10/06/2015 141  135 - 145 mmol/L Final  . Potassium 10/06/2015 3.9  3.5 - 5.1 mmol/L Final  . Chloride 10/06/2015 105  101 - 111 mmol/L Final  . CO2 10/06/2015 29  22 - 32 mmol/L Final  . Glucose, Bld 10/06/2015 114* 65 - 99 mg/dL Final  . BUN 10/06/2015 20  6 - 20 mg/dL Final  . Creatinine, Ser 10/06/2015 1.05* 0.44 - 1.00 mg/dL Final  . Calcium 10/06/2015 8.9  8.9 -  10.3 mg/dL Final  . GFR calc non Af Amer 10/06/2015 55* >60 mL/min Final  . GFR calc Af Amer 10/06/2015 >60  >60 mL/min Final   Comment: (NOTE) The eGFR has been calculated using the CKD EPI equation. This calculation has not been validated in all clinical situations. eGFR's persistently <60 mL/min signify possible Chronic Kidney Disease.   Georgiann Hahn gap 10/06/2015 7  5 - 15 Final  Hospital Outpatient Visit on 09/27/2015  Component Date Value Ref Range Status  . WBC 09/27/2015 6.2  4.0 - 10.5 K/uL Final  . RBC 09/27/2015 4.44  3.87 - 5.11 MIL/uL Final  . Hemoglobin 09/27/2015 13.0  12.0 - 15.0 g/dL Final  . HCT 09/27/2015 39.4  36.0 - 46.0 % Final  . MCV 09/27/2015 88.7  78.0 - 100.0 fL Final  . MCH 09/27/2015 29.3  26.0 - 34.0 pg Final  . MCHC 09/27/2015 33.0  30.0 - 36.0 g/dL Final  . RDW 09/27/2015 12.5  11.5 - 15.5 % Final  . Platelets 09/27/2015 305  150 - 400 K/uL Final  . Neutrophils Relative % 09/27/2015 61   Final  . Neutro Abs 09/27/2015 3.8  1.7 - 7.7 K/uL Final  . Lymphocytes Relative 09/27/2015 30   Final  . Lymphs Abs 09/27/2015 1.8  0.7 - 4.0 K/uL Final  . Monocytes Relative 09/27/2015 7   Final  . Monocytes Absolute 09/27/2015 0.4  0.1 - 1.0 K/uL Final  . Eosinophils Relative 09/27/2015 2   Final  . Eosinophils Absolute 09/27/2015 0.1  0.0 - 0.7 K/uL Final  . Basophils Relative 09/27/2015 0   Final  . Basophils Absolute 09/27/2015 0.0  0.0 - 0.1 K/uL Final  . Sodium 09/27/2015 139  135 - 145 mmol/L Final  . Potassium 09/27/2015 4.0  3.5 - 5.1 mmol/L Final  . Chloride 09/27/2015 103  101 - 111 mmol/L Final  . CO2 09/27/2015 27  22 - 32 mmol/L Final  . Glucose, Bld 09/27/2015 132* 65 - 99 mg/dL Final  . BUN 09/27/2015 24* 6 - 20 mg/dL Final  . Creatinine, Ser 09/27/2015 0.92  0.44 - 1.00 mg/dL Final  . Calcium 09/27/2015 9.6  8.9 - 10.3 mg/dL Final  . Total Protein 09/27/2015 7.3  6.5 - 8.1 g/dL Final  . Albumin 09/27/2015 4.1  3.5 - 5.0 g/dL Final  . AST  09/27/2015 31  15 - 41 U/L Final  . ALT 09/27/2015 37  14 - 54 U/L Final  . Alkaline Phosphatase 09/27/2015 79  38 - 126 U/L Final  . Total Bilirubin 09/27/2015 0.5  0.3 - 1.2 mg/dL Final  . GFR calc non Af Amer 09/27/2015 >60  >60 mL/min Final  .  GFR calc Af Amer 09/27/2015 >60  >60 mL/min Final   Comment: (NOTE) The eGFR has been calculated using the CKD EPI equation. This calculation has not been validated in all clinical situations. eGFR's persistently <60 mL/min signify possible Chronic Kidney Disease.   . Anion gap 09/27/2015 9  5 - 15 Final  . Prothrombin Time 09/27/2015 13.1  11.6 - 15.2 seconds Final  . INR 09/27/2015 0.97  0.00 - 1.49 Final  . ABO/RH(D) 09/27/2015 O POS   Final  . Antibody Screen 09/27/2015 NEG   Final  . Sample Expiration 09/27/2015 10/07/2015   Final  . Extend sample reason 09/27/2015 NO TRANSFUSIONS OR PREGNANCY IN THE PAST 3 MONTHS   Final  . Color, Urine 09/27/2015 YELLOW  YELLOW Final  . APPearance 09/27/2015 CLEAR  CLEAR Final  . Specific Gravity, Urine 09/27/2015 1.012  1.005 - 1.030 Final  . pH 09/27/2015 6.5  5.0 - 8.0 Final  . Glucose, UA 09/27/2015 NEGATIVE  NEGATIVE mg/dL Final  . Hgb urine dipstick 09/27/2015 NEGATIVE  NEGATIVE Final  . Bilirubin Urine 09/27/2015 NEGATIVE  NEGATIVE Final  . Ketones, ur 09/27/2015 NEGATIVE  NEGATIVE mg/dL Final  . Protein, ur 09/27/2015 NEGATIVE  NEGATIVE mg/dL Final  . Nitrite 09/27/2015 NEGATIVE  NEGATIVE Final  . Leukocytes, UA 09/27/2015 NEGATIVE  NEGATIVE Final   MICROSCOPIC NOT DONE ON URINES WITH NEGATIVE PROTEIN, BLOOD, LEUKOCYTES, NITRITE, OR GLUCOSE <1000 mg/dL.  Marland Kitchen MRSA, PCR 09/27/2015 NEGATIVE  NEGATIVE Final  . Staphylococcus aureus 09/27/2015 NEGATIVE  NEGATIVE Final   Comment:        The Xpert SA Assay (FDA approved for NASAL specimens in patients over 67 years of age), is one component of a comprehensive surveillance program.  Test performance has been validated by [facility] for  patients greater than or equal to 32 year old. It is not intended to diagnose infection nor to guide or monitor treatment.   . ABO/RH(D) 09/27/2015 O POS   Final     X-Rays:Dg Chest 2 View  09/27/2015  CLINICAL DATA:  Examination prior to right total knee arthroplasty, history of hypertension, nonsmoker. None in PACs EXAM: CHEST  2 VIEW COMPARISON:  None. FINDINGS: The lungs are well-expanded. There is no focal infiltrate. The interstitial markings are mildly prominent in the infrahilar regions. There is likely scarring in the lower retrosternal region on the lateral view. The heart and pulmonary vascularity are normal. The mediastinum is normal in width. There is no pleural effusion. There is mild multilevel degenerative disc space narrowing of the thoracic spine. IMPRESSION: Mild chronic bronchitic changes. There is no pneumonia, CHF, nor other acute cardiopulmonary disease. Electronically Signed   By: David  Martinique M.D.   On: 09/27/2015 09:45     Hospital Course: BAELEIGH DEVINCENT is a 65 y.o. who was admitted to [facility]. They were brought to the operating room on 10/04/2015 and underwent Procedure(s): RIGHT TOTAL KNEE ARTHROPLASTY.  Patient tolerated the procedure well and was later transferred to the recovery room and then to the orthopaedic floor for postoperative care.  They were given PO and IV analgesics for pain control following their surgery.  They were given 24 hours of postoperative antibiotics of  Anti-infectives    Start     Dose/Rate Route Frequency Ordered Stop   10/04/15 1300  ceFAZolin (ANCEF) IVPB 1 g/50 mL premix     1 g 100 mL/hr over 30 Minutes Intravenous Every 6 hours 10/04/15 1103 10/04/15 1855   10/04/15 0802  polymyxin B 500,000 Units, bacitracin 50,000 Units in sodium chloride irrigation 0.9 % 500 mL irrigation  Status:  Discontinued       As needed 10/04/15 0802 10/04/15 0927   10/04/15 0518  ceFAZolin (ANCEF) IVPB 2 g/50 mL premix     2 g 100 mL/hr  over 30 Minutes Intravenous On call to O.R. 10/04/15 0518 10/04/15 0727     and started on DVT prophylaxis in the form of Xarelto.   PT and OT were ordered for total joint protocol.  Discharge planning consulted to help with postop disposition and equipment needs.  Patient had a good night on the evening of surgery.  They started to get up OOB with therapy on day one. Hemovac drain was pulled without difficulty.  Continued to work with therapy into day two.  Dressing was changed on day two and the incision was clean and dry.  Incision was healing well.  Patient was seen in rounds and was ready to go home. Patient was transitioned from Xarelto to aspirin upon DC home.    Diet: Cardiac diet Activity:WBAT Follow-up:in 2 weeks Disposition - Home Discharged Condition: stable   Discharge Instructions    Call MD / Call 911    Complete by:  As directed   If you experience chest pain or shortness of breath, CALL 911 and be transported to the hospital emergency room.  If you develope a fever above 101 F, pus (white drainage) or increased drainage or redness at the wound, or calf pain, call your surgeon's office.     Constipation Prevention    Complete by:  As directed   Drink plenty of fluids.  Prune juice may be helpful.  You may use a stool softener, such as Colace (over the counter) 100 mg twice a day.  Use MiraLax (over the counter) for constipation as needed.     Diet - low sodium heart healthy    Complete by:  As directed      Discharge instructions    Complete by:  As directed   INSTRUCTIONS AFTER JOINT REPLACEMENT   Remove items at home which could result in a fall. This includes throw rugs or furniture in walking pathways ICE to the affected joint every three hours while awake for 30 minutes at a time, for at least the first 3-5 days, and then as needed for pain and swelling.  Continue to use ice for pain and swelling. You may notice swelling that will progress down to the foot and ankle.   This is normal after surgery.  Elevate your leg when you are not up walking on it.   Continue to use the breathing machine you got in the hospital (incentive spirometer) which will help keep your temperature down.  It is common for your temperature to cycle up and down following surgery, especially at night when you are not up moving around and exerting yourself.  The breathing machine keeps your lungs expanded and your temperature down.   DIET:  As you were doing prior to hospitalization, we recommend a well-balanced diet.  DRESSING / WOUND CARE / SHOWERING  You may change the dressing every day with sterile gauze.  Please use good hand washing techniques before changing the dressing.  Do not use any lotions or creams on the incision until instructed by your surgeon.  ACTIVITY  Increase activity slowly as tolerated, but follow the weight bearing instructions below.   No driving for 6 weeks or until further direction given  by your physician.  You cannot drive while taking narcotics.  No lifting or carrying greater than 10 lbs. until further directed by your surgeon. Avoid periods of inactivity such as sitting longer than an hour when not asleep. This helps prevent blood clots.  You may return to work once you are authorized by your doctor.     WEIGHT BEARING   Weight bearing as tolerated with assist device (walker, cane, etc) as directed, use it as long as suggested by your surgeon or therapist, typically at least 4-6 weeks.   EXERCISES  Results after joint replacement surgery are often greatly improved when you follow the exercise, range of motion and muscle strengthening exercises prescribed by your doctor. Safety measures are also important to protect the joint from further injury. Any time any of these exercises cause you to have increased pain or swelling, decrease what you are doing until you are comfortable again and then slowly increase them. If you have problems or questions, call  your caregiver or physical therapist for advice.   Rehabilitation is important following a joint replacement. After just a few days of immobilization, the muscles of the leg can become weakened and shrink (atrophy).  These exercises are designed to build up the tone and strength of the thigh and leg muscles and to improve motion. Often times heat used for twenty to thirty minutes before working out will loosen up your tissues and help with improving the range of motion but do not use heat for the first two weeks following surgery (sometimes heat can increase post-operative swelling).   These exercises can be done on a training (exercise) mat, on the floor, on a table or on a bed. Use whatever works the best and is most comfortable for you.    Use music or television while you are exercising so that the exercises are a pleasant break in your day. This will make your life better with the exercises acting as a break in your routine that you can look forward to.   Perform all exercises about fifteen times, three times per day or as directed.  You should exercise both the operative leg and the other leg as well.  Exercises include:   Quad Sets - Tighten up the muscle on the front of the thigh (Quad) and hold for 5-10 seconds.   Straight Leg Raises - With your knee straight (if you were given a brace, keep it on), lift the leg to 60 degrees, hold for 3 seconds, and slowly lower the leg.  Perform this exercise against resistance later as your leg gets stronger.  Leg Slides: Lying on your back, slowly slide your foot toward your buttocks, bending your knee up off the floor (only go as far as is comfortable). Then slowly slide your foot back down until your leg is flat on the floor again.  Angel Wings: Lying on your back spread your legs to the side as far apart as you can without causing discomfort.  Hamstring Strength:  Lying on your back, push your heel against the floor with your leg straight by tightening up  the muscles of your buttocks.  Repeat, but this time bend your knee to a comfortable angle, and push your heel against the floor.  You may put a pillow under the heel to make it more comfortable if necessary.   A rehabilitation program following joint replacement surgery can speed recovery and prevent re-injury in the future due to weakened muscles. Contact your doctor or  a physical therapist for more information on knee rehabilitation.    CONSTIPATION  Constipation is defined medically as fewer than three stools per week and severe constipation as less than one stool per week.  Even if you have a regular bowel pattern at home, your normal regimen is likely to be disrupted due to multiple reasons following surgery.  Combination of anesthesia, postoperative narcotics, change in appetite and fluid intake all can affect your bowels.   YOU MUST use at least one of the following options; they are listed in order of increasing strength to get the job done.  They are all available over the counter, and you may need to use some, POSSIBLY even all of these options:    Drink plenty of fluids (prune juice may be helpful) and high fiber foods Colace 100 mg by mouth twice a day  Senokot for constipation as directed and as needed Dulcolax (bisacodyl), take with full glass of water  Miralax (polyethylene glycol) once or twice a day as needed.  If you have tried all these things and are unable to have a bowel movement in the first 3-4 days after surgery call either your surgeon or your primary doctor.    If you experience loose stools or diarrhea, hold the medications until you stool forms back up.  If your symptoms do not get better within 1 week or if they get worse, check with your doctor.  If you experience "the worst abdominal pain ever" or develop nausea or vomiting, please contact the office immediately for further recommendations for treatment.   ITCHING:  If you experience itching with your  medications, try taking only a single pain pill, or even half a pain pill at a time.  You can also use Benadryl over the counter for itching or also to help with sleep.   TED HOSE STOCKINGS:  Use stockings on both legs until for at least 2 weeks or as directed by physician office. They may be removed at night for sleeping.  MEDICATIONS:  See your medication summary on the "After Visit Summary" that nursing will review with you.  You may have some home medications which will be placed on hold until you complete the course of blood thinner medication.  It is important for you to complete the blood thinner medication as prescribed. Take aspirin 385m twice daily to prevent blood clots.  PRECAUTIONS:  If you experience chest pain or shortness of breath - call 911 immediately for transfer to the hospital emergency department.   If you develop a fever greater that 101 F, purulent drainage from wound, increased redness or drainage from wound, foul odor from the wound/dressing, or calf pain - CONTACT YOUR SURGEON.                                                   FOLLOW-UP APPOINTMENTS:  If you do not already have a post-op appointment, please call the office for an appointment to be seen by your surgeon.  Guidelines for how soon to be seen are listed in your "After Visit Summary", but are typically between 1-4 weeks after surgery.    MAKE SURE YOU:  Understand these instructions.  Get help right away if you are not doing well or get worse.    Thank you for letting [facility] a part of your medical  care team.  It is a privilege we respect greatly.  We hope these instructions will help you stay on track for a fast and full recovery!     Increase activity slowly as tolerated    Complete by:  As directed             Medication List    STOP taking these medications        aspirin 81 MG tablet      TAKE these medications        ALPRAZolam 0.5 MG tablet  Commonly known as:  XANAX  Take 0.25-0.5  mg by mouth 4 (four) times daily as needed for anxiety or sleep. Pt usually only takes only 1/2 tab at night     CALCIUM 600 + D PO  Take 1 tablet by mouth 2 (two) times daily.     fish oil-omega-3 fatty acids 1000 MG capsule  Take 1 g by mouth 2 (two) times daily.     hydrochlorothiazide 25 MG tablet  Commonly known as:  HYDRODIURIL  Take 25 mg by mouth daily.     ibuprofen 200 MG tablet  Commonly known as:  ADVIL,MOTRIN  Take 400 mg by mouth every 6 (six) hours as needed for mild pain.     levothyroxine 25 MCG tablet  Commonly known as:  SYNTHROID, LEVOTHROID  Take 25 mcg by mouth daily.     methocarbamol 500 MG tablet  Commonly known as:  ROBAXIN  Take 1 tablet (500 mg total) by mouth every 6 (six) hours as needed for muscle spasms.     omeprazole 40 MG capsule  Commonly known as:  PRILOSEC  Take 40 mg by mouth daily.     oxyCODONE-acetaminophen 5-325 MG tablet  Commonly known as:  PERCOCET/ROXICET  Take 1-2 tablets by mouth every 4 (four) hours as needed for moderate pain.     promethazine 12.5 MG tablet  Commonly known as:  PHENERGAN  Take 1 tablet (12.5 mg total) by mouth every 6 (six) hours as needed for nausea or vomiting.           Follow-up Information    Follow up with GIOFFRE,RONALD A, MD. Schedule an appointment as soon as possible for a visit in 2 weeks.   Specialty:  Orthopedic Surgery   Contact information:   [address] [place] [address] [phone]       Follow up with [facility].   Why:  physical therapy   Contact information:   [address] [place] [address] [phone]       Signed: Ardeen Jourdain, PA-C Orthopaedic Surgery 10/13/2015, 8:21 AM

## 2015-10-25 ENCOUNTER — Ambulatory Visit (HOSPITAL_COMMUNITY): Payer: 59 | Attending: Orthopedic Surgery | Admitting: Physical Therapy

## 2015-10-25 DIAGNOSIS — Z96651 Presence of right artificial knee joint: Secondary | ICD-10-CM | POA: Diagnosis not present

## 2015-10-25 DIAGNOSIS — R2689 Other abnormalities of gait and mobility: Secondary | ICD-10-CM | POA: Diagnosis present

## 2015-10-25 DIAGNOSIS — R29898 Other symptoms and signs involving the musculoskeletal system: Secondary | ICD-10-CM | POA: Diagnosis present

## 2015-10-25 DIAGNOSIS — R269 Unspecified abnormalities of gait and mobility: Secondary | ICD-10-CM | POA: Diagnosis present

## 2015-10-25 DIAGNOSIS — M25661 Stiffness of right knee, not elsewhere classified: Secondary | ICD-10-CM | POA: Insufficient documentation

## 2015-10-25 NOTE — Patient Instructions (Addendum)
Strengthening: Quadriceps Set    Tighten muscles on top of thighs by pushing knees down into surface. Hold _3___ seconds. Repeat _10___ times per set. Do ____ sets per session. Do ___3_ sessions per day. 1 http://orth.exer.us/602   Copyright  VHI. All rights reserved.  Self-Mobilization: Heel Slide (Supine)    Slide left heel toward buttocks until a gentle stretch is felt. Hold __3__ seconds. Relax. Repeat _10___ times per set. Do __1__ sets per session. Do _2___ sessions per day.  http://orth.exer.us/710   Copyright  VHI. All rights reserved.  Stretching: Hamstring (Supine)    Supporting right thigh behind knee, slowly straighten knee until stretch is felt in back of thigh. Hold __30__ seconds. Repeat __3__ times per set. Do __1__ sets per session. Do __2__ sessions per day.  http://orth.exer.us/656   Copyright  VHI. All rights reserved.  Knee Extension Mobilization: Towel Prop    With rolled towel under right ankle, place _0___ pound weight across knee. Hold ___30_ minutes. Repeat 1____ times per set. Do _1__ sets per session. Do __2__ sessions per day.  http://orth.exer.us/720   Copyright  VHI. All rights reserved.  Knee Extension (Sitting)    Place ___0_ pound weight on left ankle and straighten knee fully, lower slowly. Repeat ____ times per set. Do _1___ sets per session. Do __3__ sessions per day. 10 http://orth.exer.us/732   Copyright  VHI. All rights reserved.

## 2015-10-25 NOTE — Addendum Note (Signed)
Addended by: Leeroy Cha on: 10/25/2015 03:02 PM   Modules accepted: Orders

## 2015-10-25 NOTE — Therapy (Signed)
[place], [place], [address] Phone: [phone]   Fax:  [phone]  Physical Therapy Evaluation  Patient Details  Name: Holly Callahan MRN: [id number] Date of Birth: 03-Sep-1951 Referring Provider: Gladstone Lighter  Encounter Date: 10/25/2015      PT End of Session - 10/25/15 1228    Visit Number 1   Number of Visits 8   Date for PT Re-Evaluation 11/24/15   Authorization Type [employer]    PT Start Time 1150   PT Stop Time 1230   PT Time Calculation (min) 40 min      Past Medical History  Diagnosis Date  . Hypertension   . Hypothyroidism   . PONV (postoperative nausea and vomiting)   . Numbness     hands bilat secondary to carpal tunnel   . Anxiety   . GERD (gastroesophageal reflux disease)   . Arthritis     Past Surgical History  Procedure Laterality Date  . Cholecystectomy    . Cesarean section  1988  . Colonoscopy  10/28/2011    Procedure: COLONOSCOPY;  Surgeon: Daneil Dolin, MD;  [facility];  Service: Endoscopy;  Laterality: N/A;  8:30 AM  . Total knee arthroplasty Right 10/04/2015    Procedure: RIGHT TOTAL KNEE ARTHROPLASTY;  Surgeon: Latanya Maudlin, MD;  [facility];  Service: Orthopedics;  Laterality: Right;    There were no vitals filed for this visit.  Visit Diagnosis:  Status post total right knee replacement - Plan: PT plan of care cert/re-cert  Abnormality of gait - Plan: PT plan of care cert/re-cert  Stiffness of knee joint, right - Plan: PT plan of care cert/re-cert  Poor balance - Plan: PT plan of care cert/re-cert  Right leg weakness - Plan: PT plan of care cert/re-cert      Subjective Assessment - 10/25/15 1450    Subjective Holly Callahan states that she had a Rt TKR on 10/04/2015 thru 10/06/2015.  She states she had [place] until 10/17/2014.  She is currently walking with a SPC when she is in her home but uses is when she is out.  She is going up and down her  steps but not in a reciprocal manner. She is still having difficulty completing functional activities greater than an hour and a half.  She has been referred to out-patient physical therapy to maximize her functional potential.     Pertinent History HTN   Limitations Sitting   How long can you sit comfortably? able to sit for 45 minutes    How long can you stand comfortably? able to stand for an hour     How long can you walk comfortably? ambulates with a cane for less than 5 minutes due to not getting out of her home since surgery.    Patient Stated Goals To be able to work outside again, mow, rake, flower beds, gardening    Currently in Pain? Yes   Pain Score 3    Pain Location Knee   Pain Orientation Right   Pain Descriptors / Indicators Aching   Pain Type Surgical pain            [facility] PT Assessment - 10/25/15 1202    Assessment   Medical Diagnosis Rt TKR   Referring Provider Gioffre   Onset Date/Surgical Date 10/03/16   Hand Dominance Right   Next MD Visit 10/05/2016   Prior Therapy Home health   Precautions   Precautions  None   Restrictions   Weight Bearing Restrictions No   Balance Screen   Has the patient fallen in the past 6 months No   Has the patient had a decrease in activity level because of a fear of falling?  No   Is the patient reluctant to leave their home because of a fear of falling?  No   Prior Function   Level of Independence Independent   Vocation Part time employment   Vocation Requirements sit/stand Geneticist, molecular)   Leisure yard work, Development worker, community   Overall Cognitive Status Within Functional Limits for tasks assessed   Observation/Other Assessments   Focus on Therapeutic Outcomes (FOTO)  41   Functional Tests   Functional tests Single Leg Squat;Single leg stance   Single Leg Stance   Comments lt 37; Rt  19   ROM / Strength   AROM / PROM / Strength AROM;Strength   AROM   AROM Assessment Site Knee   Right/Left Knee Right   Right  Knee Extension 12   Right Knee Flexion 103   Strength   Strength Assessment Site Hip;Knee;Ankle   Right/Left Hip Right;Left   Right Hip Flexion 5/5   Right Hip Extension 4-/5   Right Hip ABduction 5/5   Left Hip Flexion 5/5   Left Hip Extension 5/5   Left Hip ABduction 5/5   Right/Left Knee Right;Left   Right Knee Flexion 5/5   Right Knee Extension 4/5   Left Knee Flexion 5/5   Left Knee Extension 5/5   Right/Left Ankle Right;Left   Right Ankle Dorsiflexion 4/5   Left Ankle Dorsiflexion 5/5                   [facility] Adult PT Treatment/Exercise - 10/25/15 0001    Exercises   Exercises Knee/Hip   Knee/Hip Exercises: Stretches   Active Hamstring Stretch Right;3 reps;30 seconds   Active Hamstring Stretch Limitations supine   Knee/Hip Exercises: Seated   Long Arc Quad 10 reps   Knee/Hip Exercises: Supine   Quad Sets 10 reps   Heel Slides 10 reps                PT Education - 10/25/15 1220    Education provided Yes   Education Details HEP   Person(s) Educated Patient   Methods Explanation;Verbal cues;Handout   Comprehension Verbalized understanding;Returned demonstration          PT Short Term Goals - 10/25/15 1443    PT SHORT TERM GOAL #1   Title Pt to be I in HEP in order to be able to obtain goals    Time 1   Period Weeks   PT SHORT TERM GOAL #2   Title Pt ROM to be improved to 4-155 to allow normalized gait as well as sitting tolerance for over an hour without pain   Time 2   Period Weeks   PT SHORT TERM GOAL #3   Title Pt to be ambulating without an assistive device for improved mobility and decreased risk of falling    Time 2   Period Weeks           PT Long Term Goals - 10/25/15 1451    PT LONG TERM GOAL #1   Title I in advance HEP in order to obtain goals    Time 4   Period Weeks   PT LONG TERM GOAL #2   Title Pt to only have minimal swelling to allow  knee to flex to 120 degrees to allow squatting to pick items off the floor.    Time 4   Period Weeks   PT LONG TERM GOAL #3   Title Pt strength to be 5/5 to allow pt to go up and down steps in a reciprocal manner   Time 4   Period Weeks   PT LONG TERM GOAL #4   Title Pt to be able to walk for 20 minutes in order to prepare for return to work duties.    Time 6   Period Weeks               Plan - 10/25/15 1300    Clinical Impression Statement Holly Callahan is a 65 yo who has had a recent Rt TKR.  She is currently being referred to skilled PT to maximize her functional ability.  Her current deficiets include decreased ROM, decreased pain, increased edema, abnormal gait and activity intolerance.  Holly Callahan will benefit from skilled PT to address these deficits maximize her functional ability and improve her quality of life.    Pt will benefit from skilled therapeutic intervention in order to improve on the following deficits Abnormal gait;Decreased activity tolerance;Decreased balance;Decreased range of motion;Decreased strength;Difficulty walking;Pain;Increased edema   Rehab Potential Good   PT Frequency 2x / week   PT Duration 4 weeks   PT Treatment/Interventions Manual techniques;Therapeutic activities;Therapeutic exercise;Balance training;Functional mobility training;Stair training;Gait training   PT Next Visit Plan Gait; ROM emphasizing extension, strengthening and manual to decrease swelling.          Problem List Patient Active Problem List   Diagnosis Date Noted  . History of total knee arthroplasty 10/04/2015    Rayetta Humphrey, PT CLT [phone] 10/25/2015, 2:56 PM  [place] [address] [place], [place], [address] Phone: [phone]   Fax:  [phone]  Name: Holly Callahan MRN: [id number] Date of Birth: 25-Jul-1951

## 2015-10-31 ENCOUNTER — Ambulatory Visit (HOSPITAL_COMMUNITY): Payer: 59 | Admitting: Physical Therapy

## 2015-10-31 DIAGNOSIS — R2689 Other abnormalities of gait and mobility: Secondary | ICD-10-CM

## 2015-10-31 DIAGNOSIS — Z96651 Presence of right artificial knee joint: Secondary | ICD-10-CM | POA: Diagnosis not present

## 2015-10-31 DIAGNOSIS — R269 Unspecified abnormalities of gait and mobility: Secondary | ICD-10-CM

## 2015-10-31 DIAGNOSIS — R29898 Other symptoms and signs involving the musculoskeletal system: Secondary | ICD-10-CM

## 2015-10-31 DIAGNOSIS — M25661 Stiffness of right knee, not elsewhere classified: Secondary | ICD-10-CM

## 2015-10-31 NOTE — Therapy (Signed)
[place], [place], [address] Phone: [phone]   Fax:  [phone]  Physical Therapy Treatment  Patient Details  Name: BECCA VLASIC MRN: [id number] Date of Birth: 06-30-1951 Referring Provider: Gladstone Lighter  Encounter Date: 10/31/2015      PT End of Session - 10/31/15 1822    Visit Number 2   Number of Visits 8   Date for PT Re-Evaluation 11/24/15   Authorization Type [employer]    PT Start Time [id number]   PT Stop Time 1557   PT Time Calculation (min) 40 min   Activity Tolerance Patient tolerated treatment well   Behavior During Therapy [facility] for tasks assessed/performed      Past Medical History  Diagnosis Date  . Hypertension   . Hypothyroidism   . PONV (postoperative nausea and vomiting)   . Numbness     hands bilat secondary to carpal tunnel   . Anxiety   . GERD (gastroesophageal reflux disease)   . Arthritis     Past Surgical History  Procedure Laterality Date  . Cholecystectomy    . Cesarean section  1988  . Colonoscopy  10/28/2011    Procedure: COLONOSCOPY;  Surgeon: Daneil Dolin, MD;  [facility];  Service: Endoscopy;  Laterality: N/A;  8:30 AM  . Total knee arthroplasty Right 10/04/2015    Procedure: RIGHT TOTAL KNEE ARTHROPLASTY;  Surgeon: Latanya Maudlin, MD;  [facility];  Service: Orthopedics;  Laterality: Right;    There were no vitals filed for this visit.  Visit Diagnosis:  Status post total right knee replacement  Abnormality of gait  Stiffness of knee joint, right  Poor balance  Right leg weakness      Subjective Assessment - 10/31/15 1520    Subjective Patient reports that she is doing well today, still concerned about the swelling present in her knee and feels that as soon as this is resolved it will help quite a bit with her discomfort    Currently in Pain? Yes   Pain Score 3    Pain Location Knee   Pain Orientation Right                          [facility] Adult PT Treatment/Exercise - 10/31/15 0001    Knee/Hip Exercises: Stretches   Active Hamstring Stretch 3 reps;30 seconds;Right   Quad Stretch Right;3 reps;30 seconds   Knee: Self-Stretch to increase Flexion 10 seconds   Knee: Self-Stretch Limitations 10 reps    Gastroc Stretch Both;3 reps;30 seconds   Gastroc Stretch Limitations slantboard    Knee/Hip Exercises: Standing   Heel Raises Both;1 set;15 reps   Heel Raises Limitations heel toe    Terminal Knee Extension Limitations 1x15 green TB    Rocker Board 2 minutes   Rocker Board Limitations AP and lateral B HHA    Gait Training gait heel-toe focus 1x282ft    Knee/Hip Exercises: Supine   Quad Sets 15 reps   Quad Sets Limitations 3 second holds    Short Arc [employer] 15 reps   Short Arc Quad Sets Limitations 3 second holds    Other Supine Knee/Hip Exercises flexion in prone PROM, extension in supine PROM    Manual Therapy   Manual Therapy Edema management   Manual therapy comments performed separately from all other skilled interventions    Edema Management retrograde massages  PT Education - 10/31/15 1822    Education provided No          PT Short Term Goals - 10/25/15 1443    PT SHORT TERM GOAL #1   Title Pt to be I in HEP in order to be able to obtain goals    Time 1   Period Weeks   PT SHORT TERM GOAL #2   Title Pt ROM to be improved to 4-155 to allow normalized gait as well as sitting tolerance for over an hour without pain   Time 2   Period Weeks   PT SHORT TERM GOAL #3   Title Pt to be ambulating without an assistive device for improved mobility and decreased risk of falling    Time 2   Period Weeks           PT Long Term Goals - 10/25/15 1451    PT LONG TERM GOAL #1   Title I in advance HEP in order to obtain goals    Time 4   Period Weeks   PT LONG TERM GOAL #2   Title Pt to only have minimal swelling to allow knee to flex to 120  degrees to allow squatting to pick items off the floor.   Time 4   Period Weeks   PT LONG TERM GOAL #3   Title Pt strength to be 5/5 to allow pt to go up and down steps in a reciprocal manner   Time 4   Period Weeks   PT LONG TERM GOAL #4   Title Pt to be able to walk for 20 minutes in order to prepare for return to work duties.    Time 6   Period Weeks               Plan - 10/31/15 1822    Clinical Impression Statement Introduced functional stretches and exercises today with focus on mobility today; also performed retrograde massage to knee today to assist with edema. Patient reported reduced pain at end of session.    Pt will benefit from skilled therapeutic intervention in order to improve on the following deficits Abnormal gait;Decreased activity tolerance;Decreased balance;Decreased range of motion;Decreased strength;Difficulty walking;Pain;Increased edema   Rehab Potential Good   PT Frequency 2x / week   PT Duration 4 weeks   PT Treatment/Interventions Manual techniques;Therapeutic activities;Therapeutic exercise;Balance training;Functional mobility training;Stair training;Gait training   PT Next Visit Plan exercise and stretches focusing on mobility of knee, gait training, edema management    Consulted and Agree with Plan of Care Patient        Problem List Patient Active Problem List   Diagnosis Date Noted  . History of total knee arthroplasty 10/04/2015    Deniece Ree PT, DPT [place] [address] [place], [place], [address] Phone: [phone]   Fax:  [phone]  Name: RAMONDA FEDOROWICZ MRN: [id number] Date of Birth: Dec 03, 1950

## 2015-11-07 ENCOUNTER — Ambulatory Visit (HOSPITAL_COMMUNITY): Payer: 59 | Admitting: Physical Therapy

## 2015-11-07 DIAGNOSIS — R2689 Other abnormalities of gait and mobility: Secondary | ICD-10-CM

## 2015-11-07 DIAGNOSIS — R269 Unspecified abnormalities of gait and mobility: Secondary | ICD-10-CM

## 2015-11-07 DIAGNOSIS — R29898 Other symptoms and signs involving the musculoskeletal system: Secondary | ICD-10-CM

## 2015-11-07 DIAGNOSIS — M25661 Stiffness of right knee, not elsewhere classified: Secondary | ICD-10-CM

## 2015-11-07 DIAGNOSIS — Z96651 Presence of right artificial knee joint: Secondary | ICD-10-CM

## 2015-11-07 NOTE — Therapy (Signed)
[place], [place], [address] Phone: [phone]   Fax:  [phone]  Physical Therapy Treatment  Patient Details  Name: Holly Callahan MRN: [id number] Date of Birth: 12-16-1950 Referring Provider: Gladstone Lighter  Encounter Date: 11/07/2015      PT End of Session - 11/07/15 1405    Visit Number 3   Number of Visits 8   Authorization Type [employer]    PT Start Time 1350   PT Stop Time 1429   PT Time Calculation (min) 39 min   Activity Tolerance Patient tolerated treatment well   Behavior During Therapy [facility] for tasks assessed/performed      Past Medical History  Diagnosis Date  . Hypertension   . Hypothyroidism   . PONV (postoperative nausea and vomiting)   . Numbness     hands bilat secondary to carpal tunnel   . Anxiety   . GERD (gastroesophageal reflux disease)   . Arthritis     Past Surgical History  Procedure Laterality Date  . Cholecystectomy    . Cesarean section  1988  . Colonoscopy  10/28/2011    Procedure: COLONOSCOPY;  Surgeon: Daneil Dolin, MD;  [facility];  Service: Endoscopy;  Laterality: N/A;  8:30 AM  . Total knee arthroplasty Right 10/04/2015    Procedure: RIGHT TOTAL KNEE ARTHROPLASTY;  Surgeon: Latanya Maudlin, MD;  [facility];  Service: Orthopedics;  Laterality: Right;    There were no vitals filed for this visit.  Visit Diagnosis:  Status post total right knee replacement  Abnormality of gait  Stiffness of knee joint, right  Poor balance  Right leg weakness      Subjective Assessment - 11/07/15 1351    Subjective Pt states that she was doing well until the MD. told her to put a 2#weight on her ankle and straighten her leg for10 minutes.  Pt feels that he meant 10 times.  Pt lasted five minutes and was not able to go any longer.  Pt states that she woke up in significant pain.  States that she the stiffness she had been feeling in her knee returned.     Currently in Pain? Yes   Pain Score 6    Pain Location Knee   Pain Orientation Right   Pain Descriptors / Indicators Aching   Pain Type Chronic pain   Pain Onset 1 to 4 weeks ago                         [facility] Adult PT Treatment/Exercise - 11/07/15 0001    Exercises   Exercises Knee/Hip   Knee/Hip Exercises: Stretches   Active Hamstring Stretch Right;3 reps;30 seconds   Knee: Self-Stretch to increase Flexion Right;3 reps;30 seconds   Gastroc Stretch Both;2 reps;60 seconds   Gastroc Stretch Limitations slantboard    Knee/Hip Exercises: Standing   Heel Raises Both;10 reps   Knee Flexion Right;10 reps   Terminal Knee Extension Strengthening;Right;10 reps   Rocker Board 2 minutes   SLS x5   Knee/Hip Exercises: Supine   Quad Sets 10 reps   Heel Slides 15 reps   Knee Extension PROM  4   Knee Extension Limitations 3x 30"   Knee Flexion PROM  118   Knee Flexion Limitations 3x30"   Manual Therapy   Manual Therapy Edema management   Manual therapy comments performed separately from all other skilled interventions    Edema  Management retrograde massages                   PT Short Term Goals - 10/25/15 1443    PT SHORT TERM GOAL #1   Title Pt to be I in HEP in order to be able to obtain goals    Time 1   Period Weeks   PT SHORT TERM GOAL #2   Title Pt ROM to be improved to 4-155 to allow normalized gait as well as sitting tolerance for over an hour without pain   Time 2   Period Weeks   PT SHORT TERM GOAL #3   Title Pt to be ambulating without an assistive device for improved mobility and decreased risk of falling    Time 2   Period Weeks           PT Long Term Goals - 10/25/15 1451    PT LONG TERM GOAL #1   Title I in advance HEP in order to obtain goals    Time 4   Period Weeks   PT LONG TERM GOAL #2   Title Pt to only have minimal swelling to allow knee to flex to 120 degrees to allow squatting to pick items off the floor.   Time 4    Period Weeks   PT LONG TERM GOAL #3   Title Pt strength to be 5/5 to allow pt to go up and down steps in a reciprocal manner   Time 4   Period Weeks   PT LONG TERM GOAL #4   Title Pt to be able to walk for 20 minutes in order to prepare for return to work duties.    Time 6   Period Weeks               Plan - 11/07/15 1405    Clinical Impression Statement Introduced single leg raise, terminal extension and step ups with therapist facilitiation for proper technique. Pt has slight increased edema today due to completing long arc quads with 2 pounds for five minutes straight. Pt is continuing to improve in ROM.    Pt will benefit from skilled therapeutic intervention in order to improve on the following deficits Abnormal gait;Decreased activity tolerance;Decreased balance;Decreased range of motion;Decreased strength;Difficulty walking;Pain;Increased edema   PT Frequency 2x / week   PT Duration 4 weeks   PT Next Visit Plan begin lateral step ups and lunges         Problem List Patient Active Problem List   Diagnosis Date Noted  . History of total knee arthroplasty 10/04/2015   Rayetta Humphrey, PT CLT [phone] 11/07/2015, 2:31 PM  [place] [address] [place], [place], [address] Phone: [phone]   Fax:  [phone]  Name: Holly Callahan MRN: [id number] Date of Birth: August 08, 1951

## 2015-11-09 ENCOUNTER — Ambulatory Visit (HOSPITAL_COMMUNITY): Payer: 59 | Attending: Orthopedic Surgery | Admitting: Physical Therapy

## 2015-11-09 DIAGNOSIS — M25661 Stiffness of right knee, not elsewhere classified: Secondary | ICD-10-CM

## 2015-11-09 DIAGNOSIS — R29898 Other symptoms and signs involving the musculoskeletal system: Secondary | ICD-10-CM | POA: Diagnosis present

## 2015-11-09 DIAGNOSIS — Z96651 Presence of right artificial knee joint: Secondary | ICD-10-CM

## 2015-11-09 DIAGNOSIS — R2689 Other abnormalities of gait and mobility: Secondary | ICD-10-CM | POA: Diagnosis present

## 2015-11-09 DIAGNOSIS — R269 Unspecified abnormalities of gait and mobility: Secondary | ICD-10-CM | POA: Diagnosis present

## 2015-11-09 NOTE — Therapy (Signed)
[place], [place], [address] Phone: [phone]   Fax:  [phone]  Physical Therapy Treatment  Patient Details  Name: Holly Callahan MRN: [id number] Date of Birth: [birth year] Referring Provider: Gladstone Lighter  Encounter Date: 11/09/2015      PT End of Session - 11/09/15 1323    Visit Number 4   Number of Visits 8   Date for PT Re-Evaluation 11/24/15   Authorization Type [employer]    PT Start Time 1302   PT Stop Time 1345   PT Time Calculation (min) 43 min   Activity Tolerance Patient tolerated treatment well   Behavior During Therapy [facility] for tasks assessed/performed      Past Medical History  Diagnosis Date  . Hypertension   . Hypothyroidism   . PONV (postoperative nausea and vomiting)   . Numbness     hands bilat secondary to carpal tunnel   . Anxiety   . GERD (gastroesophageal reflux disease)   . Arthritis     Past Surgical History  Procedure Laterality Date  . Cholecystectomy    . Cesarean section  1988  . Colonoscopy  10/28/2011    Procedure: COLONOSCOPY;  Surgeon: Daneil Dolin, MD;  [facility];  Service: Endoscopy;  Laterality: N/A;  8:30 AM  . Total knee arthroplasty Right 10/04/2015    Procedure: RIGHT TOTAL KNEE ARTHROPLASTY;  Surgeon: Latanya Maudlin, MD;  [facility];  Service: Orthopedics;  Laterality: Right;    There were no vitals filed for this visit.  Visit Diagnosis:  Status post total right knee replacement  Abnormality of gait  Stiffness of knee joint, right  Right leg weakness  Poor balance      Subjective Assessment - 11/09/15 1257    Subjective Pt states that she is still having discomfort from overdoing her exercises with the weight.  Pt is icing about 2 times a day; therapist suggested to pt to increase to four times a day.   Currently in Pain? Yes   Pain Score 6    Pain Location Knee   Pain Orientation Right   Pain Descriptors /  Indicators Aching;Tightness   Pain Type Chronic pain   Pain Onset 1 to 4 weeks ago     increased pain with weightbearing; decreased with ice and elevation            [facility] Adult PT Treatment/Exercise - 11/09/15 0001    Ambulation/Gait   Ambulation/Gait Yes   Ambulation/Gait Assistance 6: Modified independent (Device/Increase time)   Ambulation Distance (Feet) 225 Feet   Exercises   Exercises Knee/Hip   Knee/Hip Exercises: Stretches   Active Hamstring Stretch Right;3 reps;30 seconds   Quad Stretch Right;2 reps;60 seconds   Knee: Self-Stretch to increase Flexion Right;3 reps;30 seconds   Gastroc Stretch Both;60 seconds   Gastroc Stretch Limitations slantboard    Knee/Hip Exercises: Standing   Heel Raises Both;10 reps   Knee Flexion Right;10 reps   Terminal Knee Extension Strengthening;10 reps   Rocker Board 2 minutes   SLS x5   Knee/Hip Exercises: Supine   Knee Extension PROM  AROM 4 PROM 2   Knee Flexion PROM  AROM 120    Knee/Hip Exercises: Prone   Other Prone Exercises terminal extension/terminal flexion x 10 each   Manual Therapy   Manual Therapy Edema management   Manual therapy comments performed separately from all other skilled interventions    Edema Management retrograde massages  PT Education - 11/09/15 1323    Education provided Yes   Education Details to increase icing    Person(s) Educated Patient   Methods Explanation   Comprehension Verbalized understanding          PT Short Term Goals - 10/25/15 1443    PT SHORT TERM GOAL #1   Title Pt to be I in HEP in order to be able to obtain goals    Time 1   Period Weeks   PT SHORT TERM GOAL #2   Title Pt ROM to be improved to 4-155 to allow normalized gait as well as sitting tolerance for over an hour without pain   Time 2   Period Weeks   PT SHORT TERM GOAL #3   Title Pt to be ambulating without an assistive device for improved mobility and decreased risk of falling     Time 2   Period Weeks           PT Long Term Goals - 10/25/15 1451    PT LONG TERM GOAL #1   Title I in advance HEP in order to obtain goals    Time 4   Period Weeks   PT LONG TERM GOAL #2   Title Pt to only have minimal swelling to allow knee to flex to 120 degrees to allow squatting to pick items off the floor.   Time 4   Period Weeks   PT LONG TERM GOAL #3   Title Pt strength to be 5/5 to allow pt to go up and down steps in a reciprocal manner   Time 4   Period Weeks   PT LONG TERM GOAL #4   Title Pt to be able to walk for 20 minutes in order to prepare for return to work duties.    Time 6   Period Weeks               Plan - 11/09/15 1340    Clinical Impression Statement Pt continues to improve in ROM, she has slight edema suprapatellla but infrapatellar swelling is decreasing.  Manual completed to  decrease edema in suprapatella as well as working on adherent scar and decreased patella mobility. Manual done separately from other aspects of treatment.    PT Next Visit Plan begin lateral step ups and lunges (not done today in order to concentrate on heel toe gt)         Problem List Patient Active Problem List   Diagnosis Date Noted  . History of total knee arthroplasty 10/04/2015   Rayetta Humphrey, PT CLT [phone] 11/09/2015, 1:47 PM  [place] [address] [place], [place], [address] Phone: [phone]   Fax:  [phone]  Name: Holly Callahan MRN: [id number] Date of Birth: 1950-11-23

## 2015-11-14 ENCOUNTER — Ambulatory Visit (HOSPITAL_COMMUNITY): Payer: 59

## 2015-11-14 DIAGNOSIS — R269 Unspecified abnormalities of gait and mobility: Secondary | ICD-10-CM

## 2015-11-14 DIAGNOSIS — M25661 Stiffness of right knee, not elsewhere classified: Secondary | ICD-10-CM

## 2015-11-14 DIAGNOSIS — Z96651 Presence of right artificial knee joint: Secondary | ICD-10-CM | POA: Diagnosis not present

## 2015-11-14 DIAGNOSIS — R2689 Other abnormalities of gait and mobility: Secondary | ICD-10-CM

## 2015-11-14 DIAGNOSIS — R29898 Other symptoms and signs involving the musculoskeletal system: Secondary | ICD-10-CM

## 2015-11-14 NOTE — Therapy (Signed)
[place], [place], [address] Phone: [phone]   Fax:  [phone]  Physical Therapy Treatment  Patient Details  Name: ALYCEA RUDZIK MRN: [id number] Date of Birth: 09-30-1951 Referring Provider: Gladstone Lighter  Encounter Date: 11/14/2015      PT End of Session - 11/14/15 1343    Visit Number 5   Number of Visits 8   Date for PT Re-Evaluation 11/24/15   Authorization Type [employer]    PT Start Time 1338   PT Stop Time 1434   PT Time Calculation (min) 56 min   Activity Tolerance Patient tolerated treatment well   Behavior During Therapy [facility] for tasks assessed/performed      Past Medical History  Diagnosis Date  . Hypertension   . Hypothyroidism   . PONV (postoperative nausea and vomiting)   . Numbness     hands bilat secondary to carpal tunnel   . Anxiety   . GERD (gastroesophageal reflux disease)   . Arthritis     Past Surgical History  Procedure Laterality Date  . Cholecystectomy    . Cesarean section  1988  . Colonoscopy  10/28/2011    Procedure: COLONOSCOPY;  Surgeon: Daneil Dolin, MD;  [facility];  Service: Endoscopy;  Laterality: N/A;  8:30 AM  . Total knee arthroplasty Right 10/04/2015    Procedure: RIGHT TOTAL KNEE ARTHROPLASTY;  Surgeon: Latanya Maudlin, MD;  [facility];  Service: Orthopedics;  Laterality: Right;    There were no vitals filed for this visit.  Visit Diagnosis:  Status post total right knee replacement  Abnormality of gait  Stiffness of knee joint, right  Right leg weakness  Poor balance      Subjective Assessment - 11/14/15 1336    Subjective Pt stated she has been working on her walking to improve knee flexion, reports compliance with HEP daiy.  Has been applying ice around knee 3x daily.     Pertinent History HTN   Patient Stated Goals To be able to work outside again, mow, rake, flower beds, gardening    Currently in Pain? Yes   Pain  Score 5    Pain Location Knee   Pain Orientation Right   Pain Descriptors / Indicators Tender;Sore;Aching;Tightness             [facility] Adult PT Treatment/Exercise - 11/14/15 0001    Ambulation/Gait   Ambulation/Gait Yes   Ambulation/Gait Assistance 5: Supervision   Ambulation Distance (Feet) 226 Feet  2 sets   Exercises   Exercises Knee/Hip   Knee/Hip Exercises: Stretches   Active Hamstring Stretch Right;3 reps;30 seconds   Active Hamstring Stretch Limitations 12in step   Quad Stretch Right;3 reps;30 seconds   Quad Stretch Limitations prone with rope   Knee: Self-Stretch Limitations knee drives [id number] 10" on [id number] step   Gastroc Stretch 3 reps;30 seconds   Gastroc Stretch Limitations slantboard    Knee/Hip Exercises: Standing   Heel Raises Both;15 reps;3 seconds   Heel Raises Limitations Toe raises 15x   Forward Lunges Both;15 reps   Forward Lunges Limitations 6in step   Terminal Knee Extension Strengthening;10 reps   Theraband Level (Terminal Knee Extension) Level 2 (Red)   Terminal Knee Extension Limitations 5" holds   Lateral Step Up Right;10 reps;Hand Hold: 2;Step Height: 4"   Forward Step Up Both;10 reps;Hand Hold: 1;Step Height: 4"   Rocker Board 2 minutes   Rocker Board Limitations R/L and A/P  SLS Lt 50", Rt 13" max of 5   Gait Training heel to toe 2 sets x 232ft   Knee/Hip Exercises: Supine   Quad Sets 15 reps   Short Arc Quad Sets 15 reps   Heel Slides 10 reps   Heel Slides Limitations 2-122   Terminal Knee Extension 10 reps   Terminal Knee Extension Limitations therapist facilitation   Knee Extension PROM;3 sets   Manual Therapy   Manual Therapy Edema management   Manual therapy comments performed separately from all other skilled interventions; end of session with LE elevated   Edema Management retrograde massages                   PT Short Term Goals - 10/25/15 1443    PT SHORT TERM GOAL #1   Title Pt to be I in HEP in order to be able to  obtain goals    Time 1   Period Weeks   PT SHORT TERM GOAL #2   Title Pt ROM to be improved to 4-155 to allow normalized gait as well as sitting tolerance for over an hour without pain   Time 2   Period Weeks   PT SHORT TERM GOAL #3   Title Pt to be ambulating without an assistive device for improved mobility and decreased risk of falling    Time 2   Period Weeks           PT Long Term Goals - 10/25/15 1451    PT LONG TERM GOAL #1   Title I in advance HEP in order to obtain goals    Time 4   Period Weeks   PT LONG TERM GOAL #2   Title Pt to only have minimal swelling to allow knee to flex to 120 degrees to allow squatting to pick items off the floor.   Time 4   Period Weeks   PT LONG TERM GOAL #3   Title Pt strength to be 5/5 to allow pt to go up and down steps in a reciprocal manner   Time 4   Period Weeks   PT LONG TERM GOAL #4   Title Pt to be able to walk for 20 minutes in order to prepare for return to work duties.    Time 6   Period Weeks               Plan - 11/14/15 1411    Clinical Impression Statement Session focus on improving gait mechanics, AROM and progressed strengthening this session with weight bearing activities.  Minimal cueing required for heel to toe pattern and equal stance phase to improve gait mechanics.  Progressed functional strengthening, began lateral step up training and forward lunges with ability to demonstrate appropriate form following cueing.  ROM is improving, continued to focus on TKE activiities to improve knee extension with gait.  Knee continues to have edema suprapatella but inferior swelling is decreasing.  Ended session with manual techniques for edema control to reduce pain and improve AROM  Pt stated she was pain free at end of session with improved gait mechanics.   PT Next Visit Plan Increase height with step up training as able, begin lateral lunges and continue TKE and gait training.        Problem List Patient  Active Problem List   Diagnosis Date Noted  . History of total knee arthroplasty 10/04/2015   Ihor Austin, LPTA; [place]  Aldona Lento 11/14/2015, 2:44 PM  [place], [place], [address] Phone: [phone]   Fax:  [phone]  Name: SHIRYL WALKUP MRN: [id number] Date of Birth: 1950/12/20

## 2015-11-16 ENCOUNTER — Ambulatory Visit (HOSPITAL_COMMUNITY): Payer: 59

## 2015-11-16 DIAGNOSIS — R269 Unspecified abnormalities of gait and mobility: Secondary | ICD-10-CM

## 2015-11-16 DIAGNOSIS — Z96651 Presence of right artificial knee joint: Secondary | ICD-10-CM | POA: Diagnosis not present

## 2015-11-16 DIAGNOSIS — R29898 Other symptoms and signs involving the musculoskeletal system: Secondary | ICD-10-CM

## 2015-11-16 DIAGNOSIS — R2689 Other abnormalities of gait and mobility: Secondary | ICD-10-CM

## 2015-11-16 DIAGNOSIS — M25661 Stiffness of right knee, not elsewhere classified: Secondary | ICD-10-CM

## 2015-11-16 NOTE — Therapy (Signed)
[place], [place], [address] Phone: [phone]   Fax:  [phone]  Physical Therapy Treatment  Patient Details  Name: Holly Callahan MRN: [id number] Date of Birth: 03/26/1951 Referring Provider: Gladstone Lighter  Encounter Date: 11/16/2015      PT End of Session - 11/16/15 1346    Visit Number 6   Number of Visits 8   Date for PT Re-Evaluation 11/24/15   Authorization Type [employer]    PT Start Time 1340   PT Stop Time 1430   PT Time Calculation (min) 50 min   Activity Tolerance Patient tolerated treatment well   Behavior During Therapy [facility] for tasks assessed/performed      Past Medical History  Diagnosis Date  . Hypertension   . Hypothyroidism   . PONV (postoperative nausea and vomiting)   . Numbness     hands bilat secondary to carpal tunnel   . Anxiety   . GERD (gastroesophageal reflux disease)   . Arthritis     Past Surgical History  Procedure Laterality Date  . Cholecystectomy    . Cesarean section  1988  . Colonoscopy  10/28/2011    Procedure: COLONOSCOPY;  Surgeon: Daneil Dolin, MD;  [facility];  Service: Endoscopy;  Laterality: N/A;  8:30 AM  . Total knee arthroplasty Right 10/04/2015    Procedure: RIGHT TOTAL KNEE ARTHROPLASTY;  Surgeon: Latanya Maudlin, MD;  [facility];  Service: Orthopedics;  Laterality: Right;    There were no vitals filed for this visit.  Visit Diagnosis:  Status post total right knee replacement  Abnormality of gait  Stiffness of knee joint, right  Right leg weakness  Poor balance      Subjective Assessment - 11/16/15 1344    Subjective Pt stated she felt productive pain following last session, was sore and stiff, current pain scale 4-5/10.  Returned to work today for 4 hours   Pertinent History HTN   Patient Stated Goals To be able to work outside again, [employer], Marine scientist, [employer], gardening    Currently in Pain? Yes   Pain Score 4     Pain Location Knee   Pain Orientation Right   Pain Descriptors / Indicators Sore  Stiffness          [facility] Adult PT Treatment/Exercise - 11/16/15 0001    Knee/Hip Exercises: Stretches   Active Hamstring Stretch Right;3 reps;30 seconds   Active Hamstring Stretch Limitations 12in step   Gastroc Stretch 3 reps;30 seconds   Gastroc Stretch Limitations slantboard    Knee/Hip Exercises: Standing   Forward Lunges Both;15 reps   Forward Lunges Limitations 6in step   Side Lunges Both;10 reps   Side Lunges Limitations 6in step   Terminal Knee Extension Strengthening;Right;15 reps   Theraband Level (Terminal Knee Extension) Level 2 (Red)   Terminal Knee Extension Limitations 5" holds   Lateral Step Up Right;Hand Hold: 2;Step Height: 4";15 reps   Forward Step Up 15 reps;Hand Hold: 1;Step Height: 4"   Rocker Board 2 minutes   Rocker Board Limitations R/L and A/P   SLS Lt 38", Rt 8"   Manual Therapy   Manual Therapy Edema management   Manual therapy comments performed separately from all other skilled interventions; end of session with LE elevated   Edema Management retrograde massages              PT Short Term Goals - 10/25/15 1443    PT  SHORT TERM GOAL #1   Title Pt to be I in HEP in order to be able to obtain goals    Time 1   Period Weeks   PT SHORT TERM GOAL #2   Title Pt ROM to be improved to 4-155 to allow normalized gait as well as sitting tolerance for over an hour without pain   Time 2   Period Weeks   PT SHORT TERM GOAL #3   Title Pt to be ambulating without an assistive device for improved mobility and decreased risk of falling    Time 2   Period Weeks           PT Long Term Goals - 10/25/15 1451    PT LONG TERM GOAL #1   Title I in advance HEP in order to obtain goals    Time 4   Period Weeks   PT LONG TERM GOAL #2   Title Pt to only have minimal swelling to allow knee to flex to 120 degrees to allow squatting to pick items off the floor.   Time 4    Period Weeks   PT LONG TERM GOAL #3   Title Pt strength to be 5/5 to allow pt to go up and down steps in a reciprocal manner   Time 4   Period Weeks   PT LONG TERM GOAL #4   Title Pt to be able to walk for 20 minutes in order to prepare for return to work duties.    Time 6   Period Weeks               Plan - 11/16/15 1656    Clinical Impression Statement Session focus on improving functional strengthening primarly quadriceps to improve knee extension.  Pt reported increased soreness this session so no increase with step up training, did increased reps without c/o.  Added side lunges for glut strengthening with minimal cueing required for form and technique.  Pt with improved gait mechanics with good heel to toe pattern and equal stride length, minimal cueing to equalize stance phase.  Ended session with manual for edema control and soft tissue mobilization for edema and pain control.  Pt stated she was pain free at end of session.  AROM 2-122 degrees today.   PT Next Visit Plan Increase height with step up training as able, begin lateral lunges and continue TKE and gait training.        Problem List Patient Active Problem List   Diagnosis Date Noted  . History of total knee arthroplasty 10/04/2015   Ihor Austin, LPTA; [facility] [phone]  Aldona Lento 11/16/2015, 5:04 PM  [place] [address] [place], [place], [address] Phone: [phone]   Fax:  [phone]  Name: Holly Callahan MRN: [id number] Date of Birth: 03-22-1951

## 2015-11-21 ENCOUNTER — Ambulatory Visit (HOSPITAL_COMMUNITY): Payer: 59 | Admitting: Physical Therapy

## 2015-11-21 DIAGNOSIS — M25661 Stiffness of right knee, not elsewhere classified: Secondary | ICD-10-CM

## 2015-11-21 DIAGNOSIS — Z96651 Presence of right artificial knee joint: Secondary | ICD-10-CM | POA: Diagnosis not present

## 2015-11-21 DIAGNOSIS — R2689 Other abnormalities of gait and mobility: Secondary | ICD-10-CM

## 2015-11-21 DIAGNOSIS — R29898 Other symptoms and signs involving the musculoskeletal system: Secondary | ICD-10-CM

## 2015-11-21 DIAGNOSIS — R269 Unspecified abnormalities of gait and mobility: Secondary | ICD-10-CM

## 2015-11-21 NOTE — Therapy (Addendum)
[place], [place], [address] Phone: [phone]   Fax:  [phone]  Physical Therapy Treatment  Patient Details  Name: Holly Callahan MRN: [id number] Date of Birth: 13-Mar-1951 Referring Provider: Gladstone Lighter  Encounter Date: 11/21/2015      PT End of Session - 11/21/15 1416    Visit Number 7   Number of Visits 8   Date for PT Re-Evaluation 11/24/15   PT Start Time [id number]   PT Stop Time 1430   PT Time Calculation (min) 43 min   Activity Tolerance Patient tolerated treatment well      Past Medical History  Diagnosis Date  . Hypertension   . Hypothyroidism   . PONV (postoperative nausea and vomiting)   . Numbness     hands bilat secondary to carpal tunnel   . Anxiety   . GERD (gastroesophageal reflux disease)   . Arthritis     Past Surgical History  Procedure Laterality Date  . Cholecystectomy    . Cesarean section  1988  . Colonoscopy  10/28/2011    Procedure: COLONOSCOPY;  Surgeon: Daneil Dolin, MD;  [facility];  Service: Endoscopy;  Laterality: N/A;  8:30 AM  . Total knee arthroplasty Right 10/04/2015    Procedure: RIGHT TOTAL KNEE ARTHROPLASTY;  Surgeon: Latanya Maudlin, MD;  [facility];  Service: Orthopedics;  Laterality: Right;    There were no vitals filed for this visit.  Visit Diagnosis:  Status post total right knee replacement  Abnormality of gait  Stiffness of knee joint, right  Right leg weakness  Poor balance      Subjective Assessment - 11/21/15 1351    Subjective Pt states she has been on her knee quite a bit and her knee feels very stiff.   Pt has not been icing.  Pt is back at work.     Currently in Pain? Yes   Pain Score 4    Pain Location Knee   Pain Orientation Right   Pain Descriptors / Indicators Aching;Sore   Pain Type Chronic pain   Pain Onset 1 to 4 weeks ago   Pain Frequency Constant   Aggravating Factors  activity    Pain Relieving Factors  icing     Effect of Pain on Daily Activities increases    Multiple Pain Sites No                         [facility] Adult PT Treatment/Exercise - 11/21/15 1356    Exercises   Exercises Knee/Hip   Knee/Hip Exercises: Stretches   Passive Hamstring Stretch Right;2 reps;60 seconds   Quad Stretch Right;3 reps;30 seconds;Other (comment)   Knee: Self-Stretch to increase Flexion Right;2 reps;30 seconds   Knee/Hip Exercises: Standing   Heel Raises Both;10 reps   Knee Flexion Right;10 reps   SLS 3 x 30"  with one finger hold    Knee/Hip Exercises: Supine   Quad Sets 10 reps   Heel Slides Right;5 reps   Terminal Knee Extension Right;10 reps   Patellar Mobs all    Knee Extension PROM   Knee Extension Limitations 2   Knee Flexion AROM   Knee Flexion Limitations 123   Manual Therapy   Manual Therapy Edema management;Soft tissue mobilization   Manual therapy comments decongestive techniques to decrease edema                   PT Short  Term Goals - 10/25/15 1443    PT SHORT TERM GOAL #1   Title Pt to be Independent in HEP in order to be able to obtain goals    Time 1   Period Weeks   PT SHORT TERM GOAL #2   Title Pt ROM to be improved to 4-155 to allow normalized gait as well as sitting tolerance for over an hour without pain   Time 2   Period Weeks   PT SHORT TERM GOAL #3   Title Pt to be ambulating without an assistive device for improved mobility and decreased risk of falling    Time 2   Period Weeks           PT Long Term Goals - 10/25/15 1451    PT LONG TERM GOAL #1   Title Independent  in advance HEP in order to obtain goals    Time 4   Period Weeks   PT LONG TERM GOAL #2   Title Pt to only have minimal swelling to allow knee to flex to 120 degrees to allow squatting to pick items off the floor.   Time 4   Period Weeks   PT LONG TERM GOAL #3   Title Pt strength to be 5/5 to allow pt to go up and down steps in a reciprocal manner   Time 4    Period Weeks   PT LONG TERM GOAL #4   Title Pt to be able to walk for 20 minutes in order to prepare for return to work duties.    Time 6   Period Weeks               Plan - 11/21/15 1417    Clinical Impression Statement Pt session focused on improving  balance , extension and controling edema.  Manual demonstrates increased edema located anterior superior aspect with tight lateral hamstring with fascial restrictions.  Pt educated on the need to continue icing to contol edema and pain.   Pt has returned to work and had not been icing as she should.  Pt ROM 2-123    Pt will benefit from skilled therapeutic intervention in order to improve on the following deficits Abnormal gait;Decreased activity tolerance;Decreased balance;Decreased range of motion;Decreased strength;Difficulty walking;Pain;Increased edema   PT Next Visit Plan Reassess next visit.         Problem List Patient Active Problem List   Diagnosis Date Noted  . History of total knee arthroplasty 10/04/2015  Rayetta Humphrey, PT CLT [phone] 11/21/2015, 2:28 PM  [place] [address] [place], [place], [address] Phone: [phone]   Fax:  [phone]  Name: Holly Callahan MRN: [id number] Date of Birth: [birth year]

## 2015-11-23 ENCOUNTER — Ambulatory Visit (HOSPITAL_COMMUNITY): Payer: 59 | Admitting: Physical Therapy

## 2015-11-23 DIAGNOSIS — R269 Unspecified abnormalities of gait and mobility: Secondary | ICD-10-CM

## 2015-11-23 DIAGNOSIS — Z96651 Presence of right artificial knee joint: Secondary | ICD-10-CM | POA: Diagnosis not present

## 2015-11-23 DIAGNOSIS — R2689 Other abnormalities of gait and mobility: Secondary | ICD-10-CM

## 2015-11-23 DIAGNOSIS — R29898 Other symptoms and signs involving the musculoskeletal system: Secondary | ICD-10-CM

## 2015-11-23 DIAGNOSIS — M25661 Stiffness of right knee, not elsewhere classified: Secondary | ICD-10-CM

## 2015-11-23 NOTE — Therapy (Signed)
[place], [place], [address] Phone: [phone]   Fax:  [phone]  Physical Therapy Treatment  Patient Details  Name: Holly Callahan MRN: [id number] Date of Birth: Feb 06, 1951 Referring Provider: Gladstone Lighter  Encounter Date: 11/23/2015      PT End of Session - 11/23/15 1132    Visit Number 8   Number of Visits 8   Authorization Type [employer]    PT Start Time 0160   PT Stop Time 1145   PT Time Calculation (min) 40 min   Activity Tolerance Patient tolerated treatment well   Behavior During Therapy [facility] for tasks assessed/performed      Past Medical History  Diagnosis Date  . Hypertension   . Hypothyroidism   . PONV (postoperative nausea and vomiting)   . Numbness     hands bilat secondary to carpal tunnel   . Anxiety   . GERD (gastroesophageal reflux disease)   . Arthritis     Past Surgical History  Procedure Laterality Date  . Cholecystectomy    . Cesarean section  1988  . Colonoscopy  10/28/2011    Procedure: COLONOSCOPY;  Surgeon: Daneil Dolin, MD;  [facility];  Service: Endoscopy;  Laterality: N/A;  8:30 AM  . Total knee arthroplasty Right 10/04/2015    Procedure: RIGHT TOTAL KNEE ARTHROPLASTY;  Surgeon: Latanya Maudlin, MD;  [facility];  Service: Orthopedics;  Laterality: Right;    There were no vitals filed for this visit.  Visit Diagnosis:  Status post total right knee replacement  Abnormality of gait  Stiffness of knee joint, right  Right leg weakness  Poor balance      Subjective Assessment - 11/23/15 1120    Subjective Pt states that she is only working half days at this time.   She is able to do all her normal activites.   She is getting on her knees to clean but does have some sensitivity    Pertinent History HTN   Limitations Sitting   How long can you sit comfortably? able to sit for two hours now was 45 minutes    How long can you stand  comfortably? able to stand for an hour     How long can you walk comfortably? ambulates  without a cane now and has walked fo r30 minutes; was ambulating with  a cane for less than 5 minutes.   Patient Stated Goals To be able to work outside again, mow, rake, flower beds, gardening    Pain Score 4    Pain Location Knee   Pain Orientation Right   Pain Descriptors / Indicators Aching;Sore   Pain Type Surgical pain   Pain Onset 1 to 4 weeks ago   Pain Frequency Intermittent   Aggravating Factors  activity   Pain Relieving Factors ice   Effect of Pain on Daily Activities increases    Multiple Pain Sites No            [facility] PT Assessment - 11/23/15 0001    Assessment   Medical Diagnosis Rt TKR   Onset Date/Surgical Date 10/03/16   Hand Dominance Right   Next MD Visit 12/04/2015   Prior Therapy Home health   Precautions   Precautions None   Restrictions   Weight Bearing Restrictions No   Prior Function   Level of Independence Independent   Vocation Part time employment   Barista Geneticist, molecular)   Leisure  yard work, Programmer, systems Status Within Functional Limits for tasks assessed   Observation/Other Assessments   Focus on Therapeutic Outcomes (FOTO)  72 %   Functional Tests   Functional tests Single Leg Squat;Single leg stance   Single Leg Stance   Comments lt 60 seconds ws 37; ; Rt 25 was 19   AROM   Right Knee Extension 1  was 12   Right Knee Flexion 122  was 103   Strength   Right Hip Flexion 5/5   Right Hip Extension 4+/5  was 4-/5    Right Hip ABduction 5/5   Left Hip Flexion 5/5   Left Hip Extension 5/5   Left Hip ABduction 5/5   Right Knee Flexion 5/5   Right Knee Extension 5/5  was 4/5    Left Knee Flexion 5/5   Left Knee Extension 5/5   Right Ankle Dorsiflexion 5/5  was 4/5   Left Ankle Dorsiflexion 5/5                     [facility] Adult PT Treatment/Exercise - 11/23/15 0001     Exercises   Exercises Knee/Hip   Knee/Hip Exercises: Stretches   Active Hamstring Stretch Right;3 reps;30 seconds   Knee/Hip Exercises: Standing   SLS x3   Knee/Hip Exercises: Supine   Quad Sets 15 reps   Short Arc Quad Sets 15 reps   Heel Slides Right;5 reps   Terminal Knee Extension 15 reps   Knee Extension PROM   Knee Extension Limitations 1   Knee Flexion AROM   Knee Flexion Limitations 123   Manual Therapy   Manual Therapy Edema management;Soft tissue mobilization   Manual therapy comments decongestive techniques to decrease edema        Manual was completed at end of session separate from all other treatment.            PT Short Term Goals - 11/23/15 1134    PT SHORT TERM GOAL #1   Title Pt to be Iindependent HEP in order to be able to obtain goals    Time 1   Period Weeks   Status Achieved   PT SHORT TERM GOAL #2   Title Pt ROM to be improved to 4-115 to allow normalized gait as well as sitting tolerance for over an hour without pain  typo initially therpist typed 155 was to be 115   Time 2   Period Weeks   Status Achieved   PT SHORT TERM GOAL #3   Title Pt to be ambulating without an assistive device for improved mobility and decreased risk of falling    Time 2   Period Weeks   Status Achieved           PT Long Term Goals - 11/23/15 1136    PT LONG TERM GOAL #1   Title Indepedent in advance HEP in order to obtain goals    Time 4   Period Weeks   Status Achieved   PT LONG TERM GOAL #2   Title Pt to only have minimal swelling to allow knee to flex to 120 degrees to allow squatting to pick items off the floor.   Time 4   Period Weeks   Status Achieved   PT LONG TERM GOAL #3   Title Pt strength to be 5/5 to allow pt to go up and down steps in a reciprocal manner   Time 4  Period Weeks   Status Achieved   PT LONG TERM GOAL #4   Title Pt to be able to walk for 20 minutes in order to prepare for return to work duties.    Time 6   Period Weeks    Status Achieved               Plan - 11/23/15 1132    Clinical Impression Statement Pt reassessed with great gains in all aspects.  Pt still has localized swelling along suprapetella area but is icing at home. Pt given information on discount compression store in [place].   Pt is no longer in need of skilled therapy.   PT Next Visit Plan Discharge.         Problem List Patient Active Problem List   Diagnosis Date Noted  . History of total knee arthroplasty 10/04/2015  PHYSICAL THERAPY DISCHARGE SUMMARY  Visits from Start of Care: 8  Current functional level related to goals / functional outcomes: Able    Remaining deficits: none   Education / Equipment: HEP  Plan: Patient agrees to discharge.  Patient goals were met. Patient is being discharged due to meeting the stated rehab goals.  ?????      Rayetta Humphrey, PT CLT [phone] 11/23/2015, 11:44 AM Rayetta Humphrey, [place] [address] [place], [place], [address] Phone: [phone]   Fax:  [phone]  Name: YULEIDY RAPPLEYE MRN: [id number] Date of Birth: May 04, 1951

## 2015-11-27 ENCOUNTER — Encounter (HOSPITAL_COMMUNITY): Payer: 59 | Admitting: Physical Therapy

## 2015-11-29 ENCOUNTER — Encounter (HOSPITAL_COMMUNITY): Payer: 59 | Admitting: Physical Therapy

## 2015-12-05 ENCOUNTER — Encounter (HOSPITAL_COMMUNITY): Payer: 59

## 2015-12-08 ENCOUNTER — Encounter (HOSPITAL_COMMUNITY): Payer: 59 | Admitting: Physical Therapy

## 2016-03-28 ENCOUNTER — Ambulatory Visit (HOSPITAL_COMMUNITY)
Admission: RE | Admit: 2016-03-28 | Discharge: 2016-03-28 | Disposition: A | Discharge status: Home / Self Care | Payer: 59 | Source: Ambulatory Visit | Attending: Registered Nurse | Admitting: Registered Nurse

## 2016-03-28 ENCOUNTER — Other Ambulatory Visit (HOSPITAL_COMMUNITY): Payer: Self-pay | Admitting: Registered Nurse

## 2016-03-28 DIAGNOSIS — R52 Pain, unspecified: Secondary | ICD-10-CM

## 2016-03-28 DIAGNOSIS — S42452A Displaced fracture of lateral condyle of left humerus, initial encounter for closed fracture: Secondary | ICD-10-CM | POA: Diagnosis not present

## 2016-03-28 DIAGNOSIS — M79602 Pain in left arm: Secondary | ICD-10-CM | POA: Diagnosis present

## 2016-03-28 DIAGNOSIS — F419 Anxiety disorder, unspecified: Secondary | ICD-10-CM | POA: Insufficient documentation

## 2016-03-28 DIAGNOSIS — W19XXXA Unspecified fall, initial encounter: Secondary | ICD-10-CM | POA: Diagnosis not present

## 2016-12-13 IMAGING — CR DG CHEST 2V
2 series · 2 of 2 positions shown · non-contrast
Comparison: None.

CLINICAL DATA: Examination prior to right total knee arthroplasty,
history of hypertension, nonsmoker. None in PACs

EXAM:
CHEST  2 VIEW

[w chest pa]
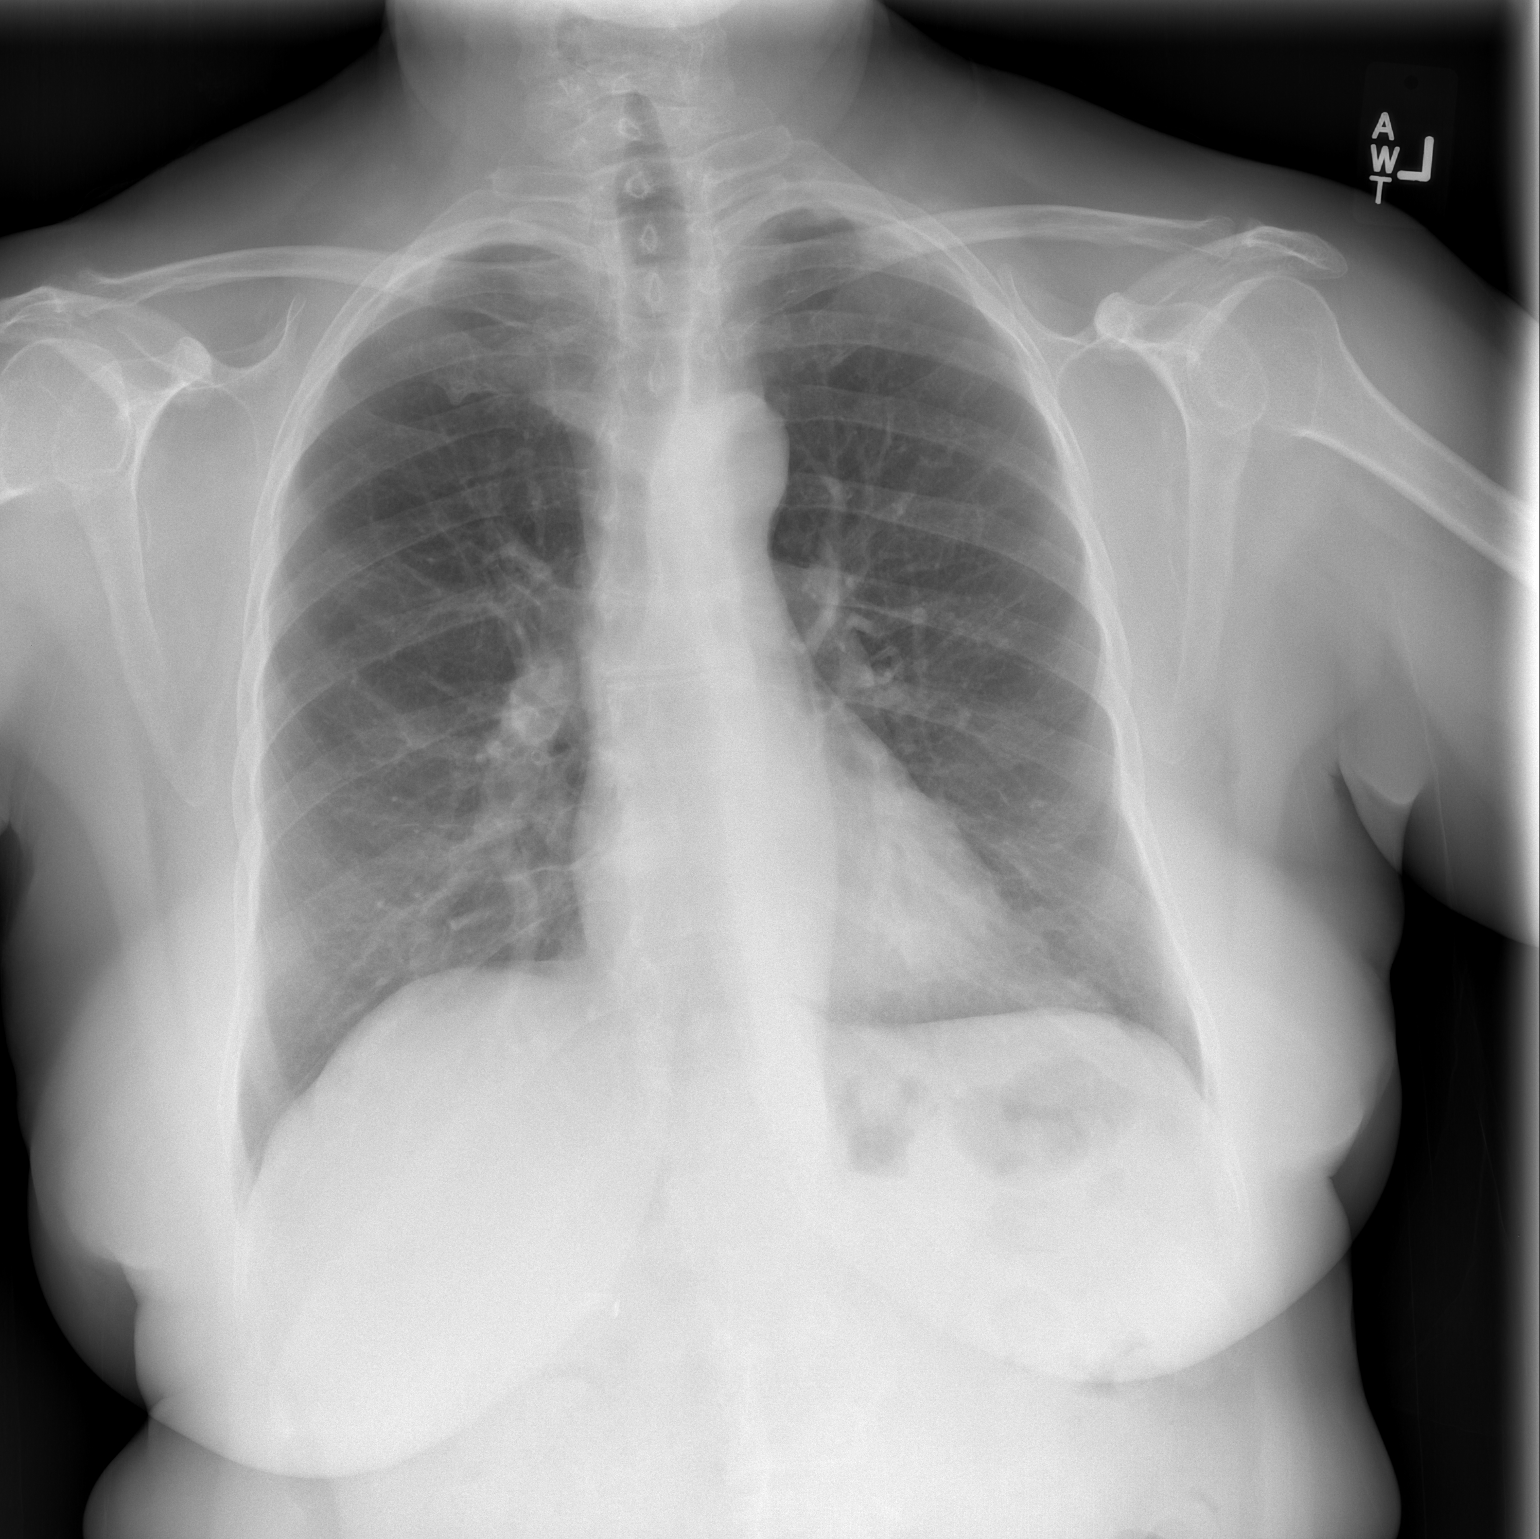

[w chest lat]
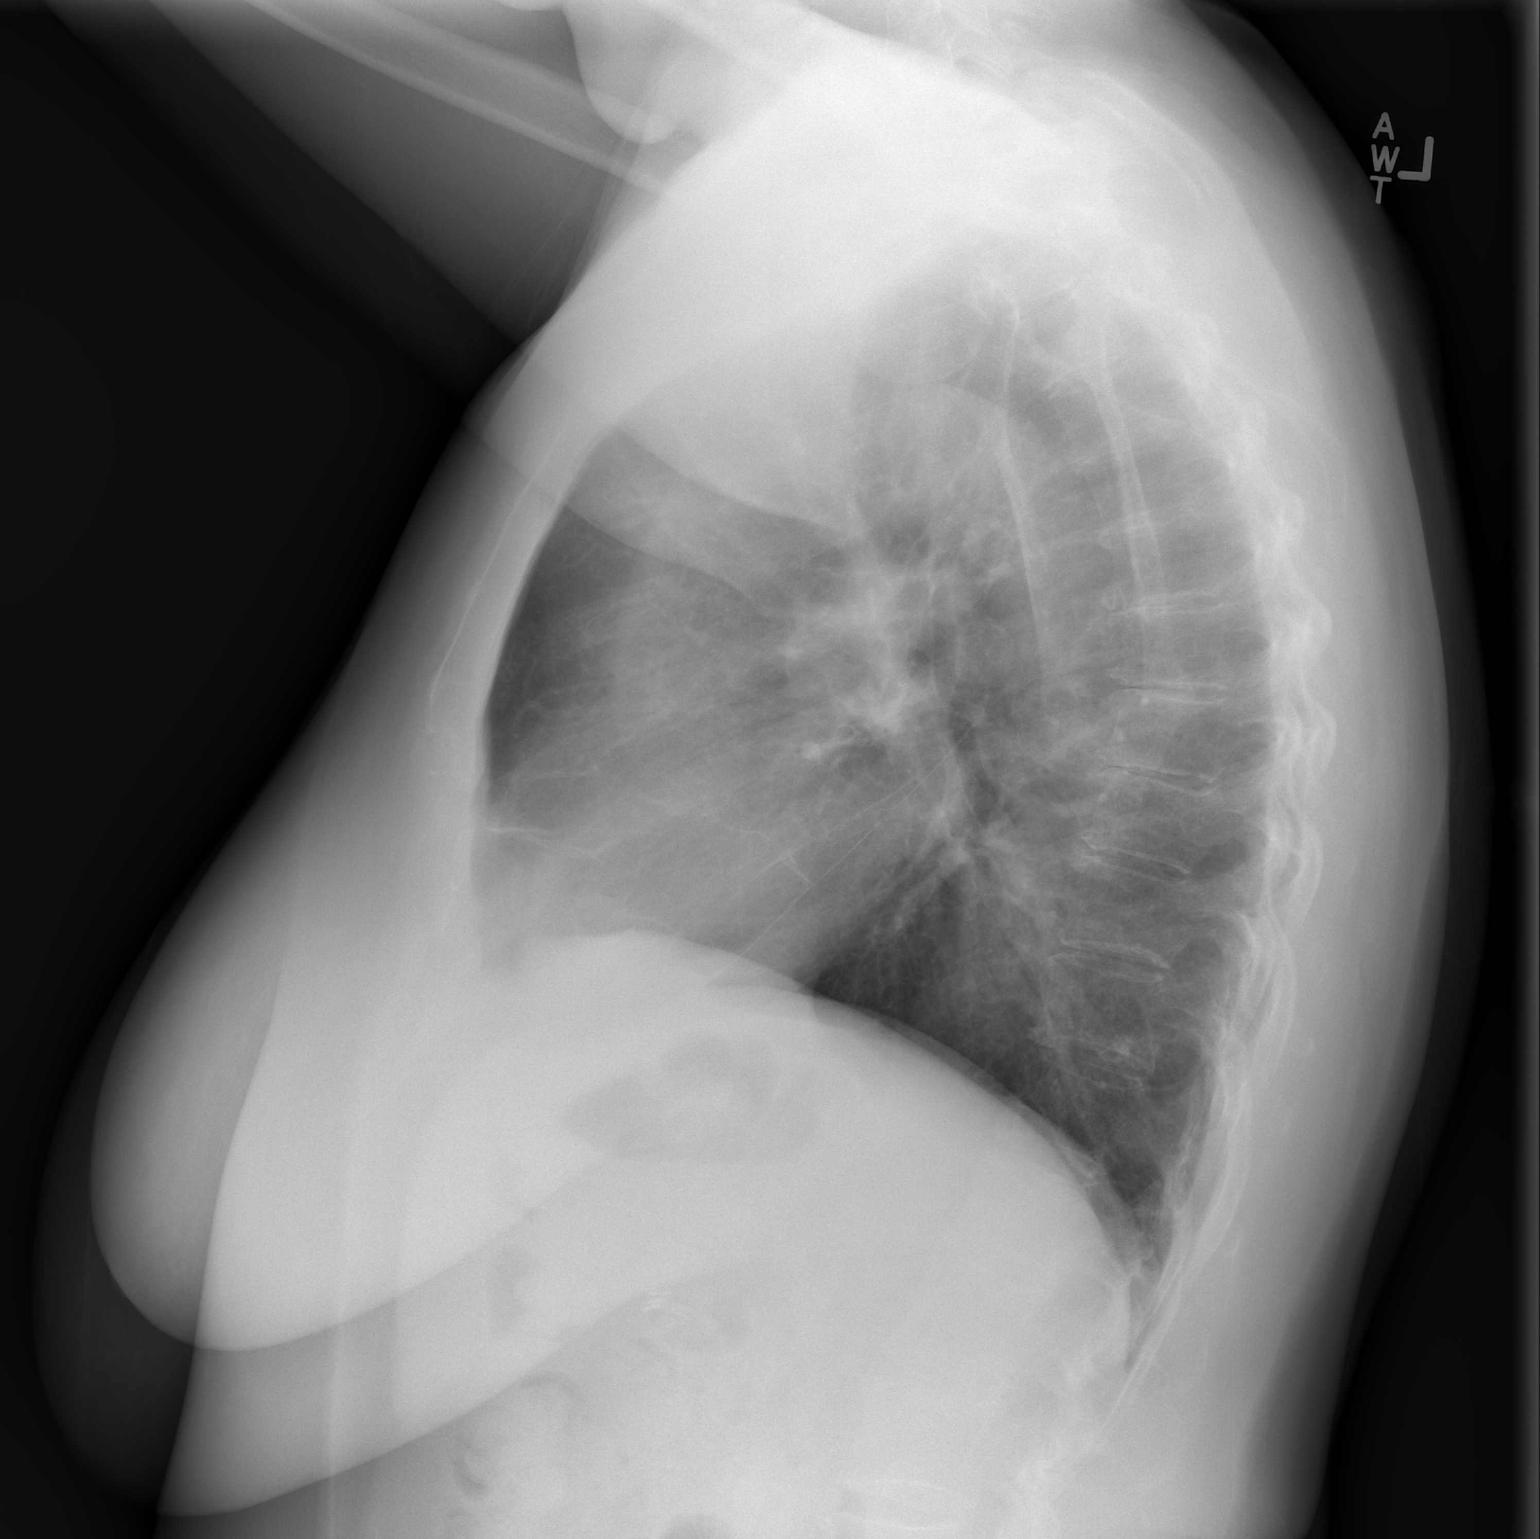

[2 of 2 positions shown; findings below may reference images not displayed]

FINDINGS: The lungs are well-expanded. There is no focal infiltrate. The
interstitial markings are mildly prominent in the infrahilar
regions. There is likely scarring in the lower retrosternal region
on the lateral view. The heart and pulmonary vascularity are normal.
The mediastinum is normal in width. There is no pleural effusion.
There is mild multilevel degenerative disc space narrowing of the
thoracic spine.
IMPRESSION: Mild chronic bronchitic changes. There is no pneumonia, CHF, nor
other acute cardiopulmonary disease.

## 2017-01-02 ENCOUNTER — Other Ambulatory Visit: Payer: Self-pay | Admitting: Obstetrics and Gynecology

## 2017-01-02 DIAGNOSIS — Z1231 Encounter for screening mammogram for malignant neoplasm of breast: Secondary | ICD-10-CM

## 2017-01-09 ENCOUNTER — Ambulatory Visit (HOSPITAL_COMMUNITY)
Admission: RE | Admit: 2017-01-09 | Discharge: 2017-01-09 | Disposition: A | Discharge status: Home / Self Care | Payer: 59 | Source: Ambulatory Visit | Attending: Obstetrics and Gynecology | Admitting: Obstetrics and Gynecology

## 2017-01-09 DIAGNOSIS — Z1231 Encounter for screening mammogram for malignant neoplasm of breast: Secondary | ICD-10-CM | POA: Diagnosis not present

## 2017-08-07 DIAGNOSIS — R69 Illness, unspecified: Secondary | ICD-10-CM | POA: Diagnosis not present

## 2017-11-06 DIAGNOSIS — R69 Illness, unspecified: Secondary | ICD-10-CM | POA: Diagnosis not present

## 2017-11-09 ENCOUNTER — Observation Stay (HOSPITAL_COMMUNITY)
Admission: EM | Admit: 2017-11-09 | Discharge: 2017-11-10 | Disposition: A | Discharge status: Home / Self Care | Payer: Medicare HMO | Attending: Internal Medicine | Admitting: Internal Medicine

## 2017-11-09 ENCOUNTER — Emergency Department (HOSPITAL_COMMUNITY): Payer: Medicare HMO

## 2017-11-09 ENCOUNTER — Observation Stay (HOSPITAL_COMMUNITY): Payer: Medicare HMO

## 2017-11-09 ENCOUNTER — Encounter (HOSPITAL_COMMUNITY): Payer: Self-pay

## 2017-11-09 ENCOUNTER — Other Ambulatory Visit: Payer: Self-pay

## 2017-11-09 DIAGNOSIS — E039 Hypothyroidism, unspecified: Secondary | ICD-10-CM | POA: Diagnosis not present

## 2017-11-09 DIAGNOSIS — I639 Cerebral infarction, unspecified: Secondary | ICD-10-CM | POA: Diagnosis not present

## 2017-11-09 DIAGNOSIS — G459 Transient cerebral ischemic attack, unspecified: Secondary | ICD-10-CM | POA: Diagnosis not present

## 2017-11-09 DIAGNOSIS — E782 Mixed hyperlipidemia: Secondary | ICD-10-CM

## 2017-11-09 DIAGNOSIS — I10 Essential (primary) hypertension: Secondary | ICD-10-CM | POA: Diagnosis not present

## 2017-11-09 DIAGNOSIS — E785 Hyperlipidemia, unspecified: Secondary | ICD-10-CM

## 2017-11-09 DIAGNOSIS — Z8673 Personal history of transient ischemic attack (TIA), and cerebral infarction without residual deficits: Secondary | ICD-10-CM | POA: Diagnosis not present

## 2017-11-09 DIAGNOSIS — R531 Weakness: Secondary | ICD-10-CM | POA: Diagnosis not present

## 2017-11-09 DIAGNOSIS — Z79899 Other long term (current) drug therapy: Secondary | ICD-10-CM | POA: Diagnosis not present

## 2017-11-09 LAB — RAPID URINE DRUG SCREEN, HOSP PERFORMED
Amphetamines: NOT DETECTED
BARBITURATES: NOT DETECTED
Benzodiazepines: NOT DETECTED
Cocaine: NOT DETECTED
Opiates: NOT DETECTED
TETRAHYDROCANNABINOL: NOT DETECTED

## 2017-11-09 LAB — COMPREHENSIVE METABOLIC PANEL
ALBUMIN: 4.2 g/dL (ref 3.5–5.0)
ALK PHOS: 83 U/L (ref 38–126)
ALT: 31 U/L (ref 14–54)
ANION GAP: 13 (ref 5–15)
AST: 29 U/L (ref 15–41)
BILIRUBIN TOTAL: 0.6 mg/dL (ref 0.3–1.2)
BUN: 20 mg/dL (ref 6–20)
CALCIUM: 9.8 mg/dL (ref 8.9–10.3)
CO2: 25 mmol/L (ref 22–32)
CREATININE: 0.85 mg/dL (ref 0.44–1.00)
Chloride: 102 mmol/L (ref 101–111)
GFR calc Af Amer: >60 mL/min (ref >60)
GFR calc non Af Amer: >60 mL/min (ref >60)
GLUCOSE: 129 mg/dL — AB (ref 65–99)
Potassium: 3.7 mmol/L (ref 3.5–5.1)
Sodium: 140 mmol/L (ref 135–145)
TOTAL PROTEIN: 7.7 g/dL (ref 6.5–8.1)

## 2017-11-09 LAB — CBC
HCT: 41.5 % (ref 36.0–46.0)
HEMOGLOBIN: 13.5 g/dL (ref 12.0–15.0)
MCH: 29.4 pg (ref 26.0–34.0)
MCHC: 32.5 g/dL (ref 30.0–36.0)
MCV: 90.4 fL (ref 78.0–100.0)
Platelets: 303 10*3/uL (ref 150–400)
RBC: 4.59 MIL/uL (ref 3.87–5.11)
RDW: 12.8 % (ref 11.5–15.5)
WBC: 5.4 10*3/uL (ref 4.0–10.5)

## 2017-11-09 LAB — I-STAT TROPONIN, ED: Troponin i, poc: 0 ng/mL (ref 0.00–0.08)

## 2017-11-09 LAB — URINALYSIS, ROUTINE W REFLEX MICROSCOPIC
Bilirubin Urine: NEGATIVE
Glucose, UA: NEGATIVE mg/dL
Hgb urine dipstick: NEGATIVE
KETONES UR: NEGATIVE mg/dL
LEUKOCYTES UA: NEGATIVE
Nitrite: NEGATIVE
PROTEIN: NEGATIVE mg/dL
Specific Gravity, Urine: 1.01 (ref 1.005–1.030)
pH: 7 (ref 5.0–8.0)

## 2017-11-09 LAB — I-STAT CHEM 8, ED
BUN: 20 mg/dL (ref 6–20)
CALCIUM ION: 1.18 mmol/L (ref 1.15–1.40)
CREATININE: 0.8 mg/dL (ref 0.44–1.00)
Chloride: 103 mmol/L (ref 101–111)
Glucose, Bld: 123 mg/dL — ABNORMAL HIGH (ref 65–99)
HCT: 42 % (ref 36.0–46.0)
Hemoglobin: 14.3 g/dL (ref 12.0–15.0)
Potassium: 3.6 mmol/L (ref 3.5–5.1)
Sodium: 142 mmol/L (ref 135–145)
TCO2: 24 mmol/L (ref 22–32)

## 2017-11-09 LAB — DIFFERENTIAL
Basophils Absolute: 0 10*3/uL (ref 0.0–0.1)
Basophils Relative: 0 %
EOS PCT: 2 %
Eosinophils Absolute: 0.1 10*3/uL (ref 0.0–0.7)
LYMPHS ABS: 1.6 10*3/uL (ref 0.7–4.0)
LYMPHS PCT: 29 %
Monocytes Absolute: 0.3 10*3/uL (ref 0.1–1.0)
Monocytes Relative: 6 %
NEUTROS PCT: 63 %
Neutro Abs: 3.4 10*3/uL (ref 1.7–7.7)

## 2017-11-09 LAB — APTT: aPTT: 34 seconds (ref 24–36)

## 2017-11-09 LAB — PROTIME-INR
INR: 0.9
Prothrombin Time: 12 seconds (ref 11.4–15.2)

## 2017-11-09 LAB — ETHANOL: Alcohol, Ethyl (B): <10 mg/dL (ref <10)

## 2017-11-09 MED ORDER — STROKE: EARLY STAGES OF RECOVERY BOOK
Freq: Once | Status: AC
  Administered 2017-11-09: 13:00:00
  Filled 2017-11-09: qty 1

## 2017-11-09 MED ORDER — ASPIRIN 325 MG PO TABS: 325.0000 mg | ORAL_TABLET | Freq: Every day | ORAL | Status: DC

## 2017-11-09 MED ORDER — ALPRAZOLAM 0.25 MG PO TABS
0.2500 mg | ORAL_TABLET | Freq: Four times a day (QID) | ORAL | Status: DC | PRN
  Administered 2017-11-09 – 2017-11-10 (×3): 0.25 mg via ORAL
  Filled 2017-11-09: qty 1
  Filled 2017-11-09: qty 2
  Filled 2017-11-09: qty 1

## 2017-11-09 MED ORDER — HYDRALAZINE HCL 20 MG/ML IJ SOLN: 10.0000 mg | Freq: Four times a day (QID) | INTRAMUSCULAR | Status: DC | PRN

## 2017-11-09 MED ORDER — ONDANSETRON HCL 4 MG/2ML IJ SOLN: 4.0000 mg | Freq: Four times a day (QID) | INTRAMUSCULAR | Status: DC | PRN

## 2017-11-09 MED ORDER — SENNOSIDES-DOCUSATE SODIUM 8.6-50 MG PO TABS: 1.0000 | ORAL_TABLET | Freq: Every evening | ORAL | Status: DC | PRN

## 2017-11-09 MED ORDER — PANTOPRAZOLE SODIUM 40 MG PO TBEC
80.0000 mg | DELAYED_RELEASE_TABLET | Freq: Every day | ORAL | Status: DC
  Administered 2017-11-09 – 2017-11-10 (×2): 80 mg via ORAL
  Filled 2017-11-09 (×2): qty 2

## 2017-11-09 MED ORDER — ACETAMINOPHEN 325 MG PO TABS
650.0000 mg | ORAL_TABLET | ORAL | Status: DC | PRN
  Administered 2017-11-09: 650 mg via ORAL
  Filled 2017-11-09: qty 2

## 2017-11-09 MED ORDER — OMEGA-3-ACID ETHYL ESTERS 1 G PO CAPS
1.0000 g | ORAL_CAPSULE | Freq: Two times a day (BID) | ORAL | Status: DC
  Administered 2017-11-09 – 2017-11-10 (×3): 1 g via ORAL
  Filled 2017-11-09 (×3): qty 1

## 2017-11-09 MED ORDER — ATORVASTATIN CALCIUM 20 MG PO TABS
20.0000 mg | ORAL_TABLET | Freq: Every day | ORAL | Status: DC
  Administered 2017-11-09 – 2017-11-10 (×2): 20 mg via ORAL
  Filled 2017-11-09 (×2): qty 1

## 2017-11-09 MED ORDER — SODIUM CHLORIDE 0.9 % IV SOLN: INTRAVENOUS | Status: DC

## 2017-11-09 MED ORDER — ACETAMINOPHEN 160 MG/5ML PO SOLN: 650.0000 mg | ORAL | Status: DC | PRN

## 2017-11-09 MED ORDER — ACETAMINOPHEN 650 MG RE SUPP: 650.0000 mg | RECTAL | Status: DC | PRN

## 2017-11-09 MED ORDER — ASPIRIN EC 325 MG PO TBEC
325.0000 mg | DELAYED_RELEASE_TABLET | Freq: Every day | ORAL | Status: DC
  Administered 2017-11-10: 325 mg via ORAL
  Filled 2017-11-09: qty 1

## 2017-11-09 MED ORDER — ENOXAPARIN SODIUM 40 MG/0.4ML ~~LOC~~ SOLN
40.0000 mg | SUBCUTANEOUS | Status: DC
  Administered 2017-11-09 – 2017-11-10 (×2): 40 mg via SUBCUTANEOUS
  Filled 2017-11-09 (×2): qty 0.4

## 2017-11-09 MED ORDER — LEVOTHYROXINE SODIUM 25 MCG PO TABS
25.0000 ug | ORAL_TABLET | Freq: Every day | ORAL | Status: DC
  Administered 2017-11-09 – 2017-11-10 (×2): 25 ug via ORAL
  Filled 2017-11-09 (×2): qty 1

## 2017-11-09 NOTE — ED Notes (Signed)
Report to Morgan, RN 

## 2017-11-09 NOTE — ED Triage Notes (Signed)
Pt reports woke up at 7am to go to restroom and felt normal.  Reports went back to bed for approx 30 min and when she got up at 0730 she had weakness in left leg.  Described as feeling "rubbery."  Pt says symptoms lasted approx 5 or 10 min.  Denies any symptoms at present.  EDP assessed pt upon arrival.

## 2017-11-09 NOTE — H&P (Signed)
History and Physical  Holly Callahan XHB:[id number] DOB: March 27, 1951 DOA: 11/09/2017   PCP: Redmond School, MD   Patient coming from: Home  Chief Complaint: Right leg weakness  HPI:  Holly Callahan is a 67 y.o. female with medical history of hypertension, GERD, hypothyroidism, anxiety presenting with right lower extremity "feeling rubbery" when she woke up on the morning of 11/09/2017 at 7:30 AM.  The patient states that she actually woke up around 7:00 a.m.  She went back to sleep, and subsequently woke up around 7:30 AM to go to the bathroom.  At that time, she felt like her right leg was dragging and feeling rubbery.  She stated that it lasted 10-15 minutes.  She states that she is back to normal without any new deficits since that time.  She denied falling, dizziness, or syncope.  She denied any dysarthria, dysphasia, headache, visual disturbance, other focal extremity weakness, or other dysesthesia.  The patient denies any new medications.  The patient has been in her usual state of health prior to waking up this morning, and she stated she was normal when she went to bed on the evening of 11/08/2017.  She denies any fevers, chills, headache, chest pain, shortness breath, dizziness, nausea, vomiting, diarrhea, abdominal pain, dysuria, hematuria, hematochezia, melena.  In the emergency department, the patient was afebrile hemodynamically stable saturating 99% on room air.  BMP, CBC, LFTs, UA were unremarkable.  CT of the brain shows subtle hypoattenuation in the bilateral internal capsule anteriorly concerning for possible lacunar stroke.  The patient was admitted for full stroke workup    Assessment/Plan: TIA -Neurology Consult -PT/OT evaluation -Speech therapy eval -CT brain-- subtle hypoattenuation in the bilateral internal capsule anteriorly concerning for possible lacunar stroke -MRI brain-- -MRA brain-- -Carotid Duplex-- -Echo-- -LDL-- -HbA1C-- -Antiplatelet--ASA 325 mg  daily  Essential hypertension -Discontinue HCTZ -Allowing permissive hypertension -Hydralazine prn SBP>220  Hypothyroidism -Continue Synthroid  Anxiety -Continue home dose alprazolam  Hyperlipidemia -Continue fish oil -Start statin          Past Medical History:  Diagnosis Date  . Anxiety   . Arthritis   . GERD (gastroesophageal reflux disease)   . Hypertension   . Hypothyroidism   . Numbness    hands bilat secondary to carpal tunnel   . PONV (postoperative nausea and vomiting)    Past Surgical History:  Procedure Laterality Date  . CESAREAN SECTION  1988  . CHOLECYSTECTOMY    . COLONOSCOPY  10/28/2011   Procedure: COLONOSCOPY;  Surgeon: Daneil Dolin, MD;  Location: [facility];  Service: Endoscopy;  Laterality: N/A;  8:30 AM  . TOTAL KNEE ARTHROPLASTY Right 10/04/2015   Procedure: RIGHT TOTAL KNEE ARTHROPLASTY;  Surgeon: Latanya Maudlin, MD;  Location: [facility];  Service: Orthopedics;  Laterality: Right;   Social History:  reports that  has never smoked. she has never used smokeless tobacco. She reports that she does not drink alcohol or use drugs.   Family history--reviewed--no family history of stroke or premature ACS  No Known Allergies   Prior to Admission medications   Medication Sig Start Date End Date Taking? Authorizing Provider  ALPRAZolam Duanne Moron) 0.5 MG tablet Take 0.25-0.5 mg by mouth 4 (four) times daily as needed for anxiety or sleep. Pt usually only takes only 1/2 tab at night    [provider]  Calcium Carb-Cholecalciferol (CALCIUM 600 + D PO) Take 1 tablet by mouth 2 (two) times daily.    [provider]  fish oil-omega-3 fatty acids 1000 MG capsule Take 1 g by mouth 2 (two) times daily.      [provider]  hydrochlorothiazide (HYDRODIURIL) 25 MG tablet Take 25 mg by mouth daily.      [provider]  ibuprofen (ADVIL,MOTRIN) 200 MG tablet Take 400 mg by mouth every 6 (six) hours as needed for mild  pain.    [provider]  levothyroxine (SYNTHROID, LEVOTHROID) 25 MCG tablet Take 25 mcg by mouth daily.      [provider]  methocarbamol (ROBAXIN) 500 MG tablet Take 1 tablet (500 mg total) by mouth every 6 (six) hours as needed for muscle spasms. 10/05/15   Latanya Maudlin, MD  omeprazole (PRILOSEC) 40 MG capsule Take 40 mg by mouth daily.    [provider]  oxyCODONE-acetaminophen (PERCOCET/ROXICET) 5-325 MG tablet Take 1-2 tablets by mouth every 4 (four) hours as needed for moderate pain. 10/05/15   Latanya Maudlin, MD  promethazine (PHENERGAN) 12.5 MG tablet Take 1 tablet (12.5 mg total) by mouth every 6 (six) hours as needed for nausea or vomiting. 10/05/15   Latanya Maudlin, MD    Review of Systems:  Constitutional:  No weight loss, night sweats, Fevers, chills, fatigue.  Head&Eyes: No headache.  No vision loss.  No eye pain or scotoma ENT:  No Difficulty swallowing,Tooth/dental problems,Sore throat,  No ear ache, post nasal drip,  Cardio-vascular:  No chest pain, Orthopnea, PND, swelling in lower extremities,  dizziness, palpitations  GI:  No  abdominal pain, nausea, vomiting, diarrhea, loss of appetite, hematochezia, melena, heartburn, indigestion, Resp:  No shortness of breath with exertion or at rest. No cough. No coughing up of blood .No wheezing.No chest wall deformity  Skin:  no rash or lesions.  GU:  no dysuria, change in color of urine, no urgency or frequency. No flank pain.  Musculoskeletal:  No joint pain or swelling. No decreased range of motion. No back pain.  Psych:  No change in mood or affect. No depression or anxiety. Neurologic: No headache, no vision loss. No syncope  Physical Exam: Vitals:   11/09/17 0930 11/09/17 1000 11/09/17 1015 11/09/17 1030  BP: (!) 175/92 (!) 190/98 (!) 184/98 (!) 183/94  Pulse: 73 72 70 64  Resp: 18 20 (!) 21 14  Temp:      TempSrc:      SpO2: 99% 96% 97% 97%  Weight:      Height:        General:  A&O x 3, NAD, nontoxic, pleasant/cooperative Head/Eye: No conjunctival hemorrhage, no icterus, [place]/AT, No nystagmus ENT:  No icterus,  No thrush, good dentition, no pharyngeal exudate Neck:  No masses, no lymphadenpathy, no bruits CV:  RRR, no rub, no gallop, no S3 Lung:  CTAB, good air movement, no wheeze, no rhonchi Abdomen: soft/NT, +BS, nondistended, no peritoneal signs Ext: No cyanosis, No rashes, No petechiae, No lymphangitis, No edema Neuro: CNII-XII intact, strength 4/5 in bilateral upper and lower extremities, no dysmetria  Labs on Admission:  Basic Metabolic Panel: Recent Labs  Lab 11/09/17 0903 11/09/17 0924  NA 140 142  K 3.7 3.6  CL 102 103  CO2 25  --   GLUCOSE 129* 123*  BUN 20 20  CREATININE 0.85 0.80  CALCIUM 9.8  --    Liver Function Tests: Recent Labs  Lab 11/09/17 0903  AST 29  ALT 31  ALKPHOS 83  BILITOT 0.6  PROT 7.7  ALBUMIN 4.2   No results  for input(s): LIPASE, AMYLASE in the last 168 hours. No results for input(s): AMMONIA in the last 168 hours. CBC: Recent Labs  Lab 11/09/17 0903 11/09/17 0924  WBC 5.4  --   NEUTROABS 3.4  --   HGB 13.5 14.3  HCT 41.5 42.0  MCV 90.4  --   PLT 303  --    Coagulation Profile: Recent Labs  Lab 11/09/17 0903  INR 0.90   Cardiac Enzymes: No results for input(s): CKTOTAL, CKMB, CKMBINDEX, TROPONINI in the last 168 hours. BNP: Invalid input(s): POCBNP CBG: No results for input(s): GLUCAP in the last 168 hours. Urine analysis:    Component Value Date/Time   COLORURINE STRAW (A) 11/09/2017 0940   APPEARANCEUR CLEAR 11/09/2017 0940   LABSPEC 1.010 11/09/2017 0940   PHURINE 7.0 11/09/2017 0940   GLUCOSEU NEGATIVE 11/09/2017 0940   HGBUR NEGATIVE 11/09/2017 0940   BILIRUBINUR NEGATIVE 11/09/2017 0940   KETONESUR NEGATIVE 11/09/2017 0940   PROTEINUR NEGATIVE 11/09/2017 0940   NITRITE NEGATIVE 11/09/2017 0940   LEUKOCYTESUR NEGATIVE 11/09/2017 0940   Sepsis  Labs: @LABRCNTIP (procalcitonin:4,lacticidven:4) )No results found for this or any previous visit (from the past 240 hour(s)).   Radiological Exams on Admission: Ct Head Wo Contrast  Result Date: 11/09/2017 CLINICAL DATA:  Left leg weakness for approximately 5-10 minutes. History of hypertension. EXAM: CT HEAD WITHOUT CONTRAST TECHNIQUE: Contiguous axial images were obtained from the base of the skull through the vertex without intravenous contrast. COMPARISON:  None. FINDINGS: Brain: No evidence of acute hemorrhage, hydrocephalus, extra-axial collection or mass lesion/mass effect. Subtle hypoattenuated appearance of bilateral internal capsules anteriorly, on the background of moderate in severity deep white matter ischemic microangiopathy. Mild brain parenchymal volume loss. Vascular: Calcific atherosclerotic disease at the skull base. Skull: Normal. Negative for fracture or focal lesion. Sinuses/Orbits: No acute finding. Other: None. IMPRESSION: Moderate in severity deep white matter ischemic microangiopathy. Subtle hypoattenuated appearance of bilateral internal capsules anteriorly may represent asymmetric microangiopathy or small lacunar infarcts. No evidence of intracranial hemorrhage. Electronically Signed   By: Fidela Salisbury M.D.   On: 11/09/2017 10:28    EKG: Independently reviewed. Sinus, nonspecific ST changes    Time spent:60 minutes Code Status:   FULL Family Communication: spouse and son updated at bedside Disposition Plan: expect 1-2 day hospitalization Consults called: neurology DVT Prophylaxis: [place] Lovenox  Orson Eva, DO  [facility] Pager [phone]  If 7PM-7AM, please contact night-coverage [email or web address] Password [email or web address] 11/09/2017, 11:12 AM

## 2017-11-09 NOTE — ED Notes (Signed)
Call to floor for report 

## 2017-11-09 NOTE — ED Provider Notes (Signed)
[facility] EMERGENCY DEPARTMENT Provider Note   CSN: [id number] Arrival date & time: 11/09/17  0831     History   Chief Complaint No chief complaint on file.   HPI Holly Callahan is a 67 y.o. female.  HPI  The patient is a 67 year old female, she has history of high blood pressure, acid reflux and hypothyroidism, reports that when she awoke this morning she had noticed that her left leg was not working properly, she was having significant difficulty ambulating and felt like she was almost dragging her left leg.  This lasted somewhere in the range of 10-15 minutes before that it kind of went away and she was able to walk much better.  She was able to go up and down steps and now states that her symptoms have resolved.  She noticed this at approximately 7:30 AM.  She has never had any focal neurologic symptoms in the past and denies any new medications, injuries, she denies any upper extremity problems, visual problems, speech problems or balance problems.  She does have a history of cerebral ischemia in the family with multiple family members including a sister with strokes, both of her parents had strokes and she is very concerned about this as well.  Past Medical History:  Diagnosis Date  . Anxiety   . Arthritis   . GERD (gastroesophageal reflux disease)   . Hypertension   . Hypothyroidism   . Numbness    hands bilat secondary to carpal tunnel   . PONV (postoperative nausea and vomiting)     Patient Active Problem List   Diagnosis Date Noted  . History of total knee arthroplasty 10/04/2015    Past Surgical History:  Procedure Laterality Date  . CESAREAN SECTION  1988  . CHOLECYSTECTOMY    . COLONOSCOPY  10/28/2011   Procedure: COLONOSCOPY;  Surgeon: Daneil Dolin, MD;  Location: [facility];  Service: Endoscopy;  Laterality: N/A;  8:30 AM  . TOTAL KNEE ARTHROPLASTY Right 10/04/2015   Procedure: RIGHT TOTAL KNEE ARTHROPLASTY;  Surgeon: Latanya Maudlin, MD;  Location: [facility];  Service: Orthopedics;  Laterality: Right;    OB History    No data available       Home Medications    Prior to Admission medications   Medication Sig Start Date End Date Taking? Authorizing Provider  ALPRAZolam Duanne Moron) 0.5 MG tablet Take 0.25-0.5 mg by mouth 4 (four) times daily as needed for anxiety or sleep. Pt usually only takes only 1/2 tab at night    [provider]  Calcium Carb-Cholecalciferol (CALCIUM 600 + D PO) Take 1 tablet by mouth 2 (two) times daily.    [provider]  fish oil-omega-3 fatty acids 1000 MG capsule Take 1 g by mouth 2 (two) times daily.      [provider]  hydrochlorothiazide (HYDRODIURIL) 25 MG tablet Take 25 mg by mouth daily.      [provider]  ibuprofen (ADVIL,MOTRIN) 200 MG tablet Take 400 mg by mouth every 6 (six) hours as needed for mild pain.    [provider]  levothyroxine (SYNTHROID, LEVOTHROID) 25 MCG tablet Take 25 mcg by mouth daily.      [provider]  methocarbamol (ROBAXIN) 500 MG tablet Take 1 tablet (500 mg total) by mouth every 6 (six) hours as needed for muscle spasms. 10/05/15   Latanya Maudlin, MD  omeprazole (PRILOSEC) 40 MG capsule Take 40 mg by mouth daily.    [provider]  oxyCODONE-acetaminophen (PERCOCET/ROXICET) 5-325 MG tablet Take 1-2 tablets by mouth every 4 (four) hours as needed for moderate pain. 10/05/15   Latanya Maudlin, MD  promethazine (PHENERGAN) 12.5 MG tablet Take 1 tablet (12.5 mg total) by mouth every 6 (six) hours as needed for nausea or vomiting. 10/05/15   Latanya Maudlin, MD    Family History No family history on file.  Social History Social History   Tobacco Use  . Smoking status: Never Smoker  . Smokeless tobacco: Never Used  Substance Use Topics  . Alcohol use: No  . Drug use: No     Allergies   Patient has no known allergies.   Review of Systems Review of Systems  All other systems reviewed and are  negative.    Physical Exam Updated Vital Signs There were no vitals taken for this visit.  Physical Exam  Constitutional: She appears well-developed and well-nourished. No distress.  HENT:  Head: Normocephalic and atraumatic.  Mouth/Throat: Oropharynx is clear and moist. No oropharyngeal exudate.  Eyes: Conjunctivae and EOM are normal. Pupils are equal, round, and reactive to light. Right eye exhibits no discharge. Left eye exhibits no discharge. No scleral icterus.  Neck: Normal range of motion. Neck supple. No JVD present. No thyromegaly present.  Cardiovascular: Normal rate, regular rhythm, normal heart sounds and intact distal pulses. Exam reveals no gallop and no friction rub.  No murmur heard. Pulmonary/Chest: Effort normal and breath sounds normal. No respiratory distress. She has no wheezes. She has no rales.  Abdominal: Soft. Bowel sounds are normal. She exhibits no distension and no mass. There is no tenderness.  Musculoskeletal: Normal range of motion. She exhibits no edema or tenderness.  Lymphadenopathy:    She has no cervical adenopathy.  Neurological: She is alert. Coordination normal.  Speech is clear, cranial nerves III through XII are intact, memory is intact, strength is normal in all 4 extremities including grips, sensation is intact to light touch and pinprick in all 4 extremities. Coordination as tested by finger-nose-finger is normal, no limb ataxia. Normal gait, normal reflexes at the patellar tendons bilaterally  Skin: Skin is warm and dry. No rash noted. No erythema.  Psychiatric: She has a normal mood and affect. Her behavior is normal.  Nursing note and vitals reviewed.    ED Treatments / Results  Labs (all labs ordered are listed, but only abnormal results are displayed) Labs Reviewed - No data to display    EKG Interpretation  Date/Time:  Sunday November 09 2017 08:47:29 EST Ventricular Rate:  78 PR Interval:    QRS Duration: 94 QT  Interval:  435 QTC Calculation: 496 R Axis:   15 Text Interpretation:  Sinus rhythm Low voltage, precordial leads Probable anteroseptal infarct, old Minimal ST depression, anterolateral leads since last tracing no significant change Confirmed by Noemi Chapel [id number]) on 11/09/2017 10:23:59 AM        Radiology No results found.  Procedures Procedures (including critical care time)  Medications Ordered in ED Medications - No data to display   Initial Impression / Assessment and Plan / ED Course  I have reviewed the triage vital signs and the nursing notes.  Pertinent labs & imaging results that were available during my care of the patient were reviewed by me and considered in my medical decision making (see chart for details).     The patient is exam is unremarkable, she has no carotid bruits on my exam, her neurologic exam is normal however her history is suggestive  of a focal neurologic deficit upon awakening which seems to have resolved.  I would question whether this was peripheral neuropathy from sleeping or whether this was a TIA however given the patient's high blood pressure history, especially noting that the patient states her blood pressure has been rising this week as well as her blood pressure here which was elevated she will need further workup.  Transient ischemic attack workup has been initiated with CT scan of the brain and labs.  Cardiac monitoring, EKG.  Labs reviewed, there are no significant acute findings including blood counts metabolic panel or other testing  I personally viewed the CT scan images and find no signs of hemorrhage masses or tumors.  I will discussed the care with the hospitalist for admission for her transient ischemic attack which likely occurred this morning  D/w Dr. Carles Collet who will admit  Vitals:   11/09/17 0901 11/09/17 0909 11/09/17 0930 11/09/17 1000  BP: (!) 111/94  (!) 175/92 (!) 190/98  Pulse: 69  73 72  Resp: 18  18 20   Temp: 98.3 F  (36.8 C) 98.3 F (36.8 C)    TempSrc: Oral     SpO2: 98%  99% 96%  Weight:      Height:           Final Clinical Impressions(s) / ED Diagnoses   Final diagnoses:  TIA (transient ischemic attack)    ED Discharge Orders    None       Noemi Chapel, MD 11/09/17 1043

## 2017-11-09 NOTE — Discharge Summary (Signed)
Physician Discharge Summary  Holly Callahan HKV:[id number] DOB: 09/07/1951 DOA: 11/09/2017  PCP: Redmond School, MD  Admit date: 11/09/2017 Discharge date: 11/10/2017  Admitted From: Home Disposition:  Home  Recommendations for Outpatient Follow-up:  1. Follow up with PCP in 1-2 weeks 2. Please obtain BMP/CBC in one week    Discharge Condition: Stable CODE STATUS: FULL Diet recommendation: Heart Healthy    Brief/Interim Summary:  67 y.o. female with medical history of hypertension, GERD, hypothyroidism, anxiety presenting with right lower extremity "feeling rubbery" when she woke up on the morning of 11/09/2017 at 7:30 AM.  The patient states that she actually woke up around 7:00 a.m.  She went back to sleep, and subsequently woke up around 7:30 AM to go to the bathroom.  At that time, she felt like her right leg was dragging and feeling rubbery.  She stated that it lasted 10-15 minutes.  She states that she is back to normal without any new deficits since that time.  She denied falling, dizziness, or syncope.  She denied any dysarthria, dysphasia, headache, visual disturbance, other focal extremity weakness, or other dysesthesia.  The patient denies any new medications.  The patient has been in her usual state of health prior to waking up this morning, and she stated she was normal when she went to bed on the evening of 11/08/2017.  She denies any fevers, chills, headache, chest pain, shortness breath, dizziness, nausea, vomiting, diarrhea, abdominal pain, dysuria, hematuria, hematochezia, melena.  In the emergency department, the patient was afebrile hemodynamically stable saturating 99% on room air.  BMP, CBC, LFTs, UA were unremarkable.  CT of the brain shows subtle hypoattenuation in the bilateral internal capsule anteriorly concerning for possible lacunar stroke.  The patient was admitted for full stroke workup    Discharge Diagnoses:  Acute ischemic stroke -Patient did not want to wait  in the hospital for neurology consult -PT/OT evaluation--no needs -Speech therapy eval--regular diet -CT brain-- subtle hypoattenuation in the bilateral internal capsule anteriorly concerning for possible lacunar stroke -MRI brain--acute right basal ganglia infarct -MRA brain--no hemodynamically significant stenosis -Carotid Duplex--no hemodynamically significant stenosis -Echo--EF 65-70%, grade 1 DD, no WMA -LDL--115 -HbA1C--6.0 -Antiplatelet--ASA 81 mg + Plavix x 90 days, then plavix alone  Essential hypertension -Allowing permissive hypertension initially -restart HCTZ -add amlodipine -Hydralazine prn SBP>220  Hypothyroidism -Continue Synthroid  Anxiety -Continue home dose alprazolam  Hyperlipidemia -Continue fish oil -lipitor 20 mg started  Impaired glucose tolerance -Hemoglobin A1c 6.0 -Lifestyle modification  Discharge Instructions  Discharge Instructions    Diet - low sodium heart healthy   Complete by:  As directed    Discharge instructions   Complete by:  As directed    Take aspirin 81 mg daily with plavix 75 mg daily x 90 days.  After 90 days, stop aspirin and take plavix 75 mg daily   Increase activity slowly   Complete by:  As directed      Allergies as of 11/10/2017   No Known Allergies     Medication List    STOP taking these medications   aspirin EC 81 MG tablet Replaced by:  aspirin 81 MG chewable tablet   ibuprofen 200 MG tablet Commonly known as:  ADVIL,MOTRIN     TAKE these medications   ALPRAZolam 0.5 MG tablet Commonly known as:  XANAX Take 0.25-0.5 mg by mouth 4 (four) times daily as needed for anxiety or sleep. Pt usually only takes only 1/2 tab at night   amLODipine 5  MG tablet Commonly known as:  NORVASC Take 1 tablet (5 mg total) by mouth daily.   amoxicillin 500 MG capsule Commonly known as:  AMOXIL Take 2,000 mg by mouth as directed. Take 4 capsules 1 hour prior to dental appointment   aspirin 81 MG chewable  tablet Chew 1 tablet (81 mg total) by mouth daily. Start taking on:  11/11/2017 Replaces:  aspirin EC 81 MG tablet   atorvastatin 20 MG tablet Commonly known as:  LIPITOR Take 1 tablet (20 mg total) by mouth daily at 6 PM.   CALCIUM 600 + D PO Take 1 tablet by mouth 2 (two) times daily.   clopidogrel 75 MG tablet Commonly known as:  PLAVIX Take 1 tablet (75 mg total) by mouth daily. Start taking on:  11/11/2017   fish oil-omega-3 fatty acids 1000 MG capsule Take 1 g by mouth 2 (two) times daily.   hydrochlorothiazide 25 MG tablet Commonly known as:  HYDRODIURIL Take 25 mg by mouth daily.   levothyroxine 25 MCG tablet Commonly known as:  SYNTHROID, LEVOTHROID Take 25 mcg by mouth daily.   omeprazole 40 MG capsule Commonly known as:  PRILOSEC Take 40 mg by mouth daily.       No Known Allergies  Consultations:  neurology   Procedures/Studies: Dg Chest 2 View  Result Date: 11/09/2017 CLINICAL DATA:  LEFT leg weakness.  Possible stroke EXAM: CHEST  2 VIEW COMPARISON:  09/27/2015. FINDINGS: Normal cardiac silhouette. Clear lung fields. No bony abnormality. Similar appearance to priors. IMPRESSION: No active cardiopulmonary disease. Electronically Signed   By: Staci Righter M.D.   On: 11/09/2017 18:20   Ct Head Wo Contrast  Result Date: 11/09/2017 CLINICAL DATA:  Left leg weakness for approximately 5-10 minutes. History of hypertension. EXAM: CT HEAD WITHOUT CONTRAST TECHNIQUE: Contiguous axial images were obtained from the base of the skull through the vertex without intravenous contrast. COMPARISON:  None. FINDINGS: Brain: No evidence of acute hemorrhage, hydrocephalus, extra-axial collection or mass lesion/mass effect. Subtle hypoattenuated appearance of bilateral internal capsules anteriorly, on the background of moderate in severity deep white matter ischemic microangiopathy. Mild brain parenchymal volume loss. Vascular: Calcific atherosclerotic disease at the skull base.  Skull: Normal. Negative for fracture or focal lesion. Sinuses/Orbits: No acute finding. Other: None. IMPRESSION: Moderate in severity deep white matter ischemic microangiopathy. Subtle hypoattenuated appearance of bilateral internal capsules anteriorly may represent asymmetric microangiopathy or small lacunar infarcts. No evidence of intracranial hemorrhage. Electronically Signed   By: Fidela Salisbury M.D.   On: 11/09/2017 10:28   Mr Brain Wo Contrast  Result Date: 11/10/2017 CLINICAL DATA:  TIA, initial exam.  Weakness in the left leg. EXAM: MRI HEAD WITHOUT CONTRAST MRA HEAD WITHOUT CONTRAST TECHNIQUE: Multiplanar, multiecho pulse sequences of the brain and surrounding structures were obtained without intravenous contrast. Angiographic images of the head were obtained using MRA technique without contrast. COMPARISON:  Head CT from yesterday FINDINGS: MRI HEAD FINDINGS Brain: Wedge-shaped area of restricted diffusion in the upper right putamen, central corona radiata, and caudate body. Elsewhere mild chronic small vessel ischemic type signal abnormality in the cerebral white matter. Nodular focus of susceptibility artifact in the left pons, presumably old microhemorrhage. No generalized chronic hemorrhagic injury. No hydrocephalus or masslike finding. Vascular: Arterial findings below. Normal dural venous sinus flow voids. Skull and upper cervical spine: Negative Sinuses/Orbits: Negative MRA HEAD FINDINGS Symmetric carotid and vertebral arteries. Irregular appearance of the bilateral ICA at the skull base that is usually related to artifact from  bony interface. Large vessels are diffusely patent and smooth. By MRA, incomplete circle-of-Willis at anterior and posterior communicating arteries. Overall mild atheromatous changes with bilateral distal P2/P3 segment stenoses. No evidence of aneurysm. IMPRESSION: Brain MRI: 1. Acute perforator infarct in the right basal ganglia. 2. Chronic small vessel ischemia in  the cerebral white matter. Intracranial MRA: 1. No acute finding. 2. No large vessel occlusion or flow limiting stenosis. 3. Atheromatous narrowing seen in the bilateral PCA. Electronically Signed   By: Monte Fantasia M.D.   On: 11/10/2017 08:12   US Carotid Bilateral (at [facility] And Ap Only)  Result Date: 11/10/2017 CLINICAL DATA:  68 year old female with a history left lower extremity weakness. Cardiovascular risk factors include hypertension, stroke/TIA EXAM: BILATERAL CAROTID DUPLEX ULTRASOUND TECHNIQUE: Pearline Cables scale imaging, color Doppler and duplex ultrasound were performed of bilateral carotid and vertebral arteries in the neck. COMPARISON:  No prior duplex FINDINGS: Criteria: Quantification of carotid stenosis is based on velocity parameters that correlate the residual internal carotid diameter with NASCET-based stenosis levels, using the diameter of the distal internal carotid lumen as the denominator for stenosis measurement. The following velocity measurements were obtained: RIGHT ICA:  Systolic 78 cm/sec, Diastolic 27 cm/sec CCA:  80 cm/sec SYSTOLIC ICA/CCA RATIO:  1.0 ECA:  130 cm/sec LEFT ICA:  Systolic 151 cm/sec, Diastolic 25 cm/sec CCA:  93 cm/sec SYSTOLIC ICA/CCA RATIO:  0.8 ECA:  114 cm/sec Right Brachial SBP: Not acquired Left Brachial SBP: Not acquired RIGHT CAROTID ARTERY: No significant calcified disease of the right common carotid artery. Intermediate waveform maintained. Heterogeneous plaque without significant calcifications at the right carotid bifurcation. Low resistance waveform of the right ICA. No significant tortuosity. RIGHT VERTEBRAL ARTERY: Antegrade flow with low resistance waveform. LEFT CAROTID ARTERY: No significant calcified disease of the left common carotid artery. Intermediate waveform maintained. Heterogeneous plaque at the left carotid bifurcation without significant calcifications. Low resistance waveform of the left ICA. LEFT VERTEBRAL ARTERY:  Antegrade flow with low  resistance waveform. IMPRESSION: Color duplex indicates minimal heterogeneous plaque, with no hemodynamically significant stenosis by duplex criteria in the extracranial cerebrovascular circulation. Signed, Dulcy Fanny. Earleen Newport, DO [facility] Electronically Signed   By: Corrie Mckusick D.O.   On: 11/10/2017 08:59   Mr Jodene Nam Head/brain VO Cm  Result Date: 11/10/2017 CLINICAL DATA:  TIA, initial exam.  Weakness in the left leg. EXAM: MRI HEAD WITHOUT CONTRAST MRA HEAD WITHOUT CONTRAST TECHNIQUE: Multiplanar, multiecho pulse sequences of the brain and surrounding structures were obtained without intravenous contrast. Angiographic images of the head were obtained using MRA technique without contrast. COMPARISON:  Head CT from yesterday FINDINGS: MRI HEAD FINDINGS Brain: Wedge-shaped area of restricted diffusion in the upper right putamen, central corona radiata, and caudate body. Elsewhere mild chronic small vessel ischemic type signal abnormality in the cerebral white matter. Nodular focus of susceptibility artifact in the left pons, presumably old microhemorrhage. No generalized chronic hemorrhagic injury. No hydrocephalus or masslike finding. Vascular: Arterial findings below. Normal dural venous sinus flow voids. Skull and upper cervical spine: Negative Sinuses/Orbits: Negative MRA HEAD FINDINGS Symmetric carotid and vertebral arteries. Irregular appearance of the bilateral ICA at the skull base that is usually related to artifact from bony interface. Large vessels are diffusely patent and smooth. By MRA, incomplete circle-of-Willis at anterior and posterior communicating arteries. Overall mild atheromatous changes with bilateral distal P2/P3 segment stenoses. No evidence of aneurysm. IMPRESSION: Brain MRI: 1. Acute perforator infarct in the right basal ganglia. 2. Chronic  small vessel ischemia in the cerebral white matter. Intracranial MRA: 1. No acute finding.  2. No large vessel occlusion or flow limiting stenosis. 3. Atheromatous narrowing seen in the bilateral PCA. Electronically Signed   By: Monte Fantasia M.D.   On: 11/10/2017 08:12        Discharge Exam: Vitals:   11/10/17 1150 11/10/17 1550  BP: (!) 162/77 (!) 158/78  Pulse: 80 82  Resp: 18 20  Temp: 98.2 F (36.8 C) 98.4 F (36.9 C)  SpO2: 96% 96%   Vitals:   11/10/17 0352 11/10/17 0830 11/10/17 1150 11/10/17 1550  BP: (!) 146/79 (!) 162/77 (!) 162/77 (!) 158/78  Pulse: (!) 56 64 80 82  Resp: 16 16 18 20   Temp: 98.4 F (36.9 C) 98.2 F (36.8 C) 98.2 F (36.8 C) 98.4 F (36.9 C)  TempSrc: Oral Oral Oral Oral  SpO2: 98% 97% 96% 96%  Weight:      Height:        General: Pt is alert, awake, not in acute distress Cardiovascular: RRR, S1/S2 +, no rubs, no gallops Respiratory: CTA bilaterally, no wheezing, no rhonchi Abdominal: Soft, NT, ND, bowel sounds + Extremities: no edema, no cyanosis   The results of significant diagnostics from this hospitalization (including imaging, microbiology, ancillary and laboratory) are listed below for reference.    Significant Diagnostic Studies: Dg Chest 2 View  Result Date: 11/09/2017 CLINICAL DATA:  LEFT leg weakness.  Possible stroke EXAM: CHEST  2 VIEW COMPARISON:  09/27/2015. FINDINGS: Normal cardiac silhouette. Clear lung fields. No bony abnormality. Similar appearance to priors. IMPRESSION: No active cardiopulmonary disease. Electronically Signed   By: Staci Righter M.D.   On: 11/09/2017 18:20   Ct Head Wo Contrast  Result Date: 11/09/2017 CLINICAL DATA:  Left leg weakness for approximately 5-10 minutes. History of hypertension. EXAM: CT HEAD WITHOUT CONTRAST TECHNIQUE: Contiguous axial images were obtained from the base of the skull through the vertex without intravenous contrast. COMPARISON:  None. FINDINGS: Brain: No evidence of acute hemorrhage, hydrocephalus, extra-axial collection or mass lesion/mass effect. Subtle  hypoattenuated appearance of bilateral internal capsules anteriorly, on the background of moderate in severity deep white matter ischemic microangiopathy. Mild brain parenchymal volume loss. Vascular: Calcific atherosclerotic disease at the skull base. Skull: Normal. Negative for fracture or focal lesion. Sinuses/Orbits: No acute finding. Other: None. IMPRESSION: Moderate in severity deep white matter ischemic microangiopathy. Subtle hypoattenuated appearance of bilateral internal capsules anteriorly may represent asymmetric microangiopathy or small lacunar infarcts. No evidence of intracranial hemorrhage. Electronically Signed   By: Fidela Salisbury M.D.   On: 11/09/2017 10:28   Mr Brain Wo Contrast  Result Date: 11/10/2017 CLINICAL DATA:  TIA, initial exam.  Weakness in the left leg. EXAM: MRI HEAD WITHOUT CONTRAST MRA HEAD WITHOUT CONTRAST TECHNIQUE: Multiplanar, multiecho pulse sequences of the brain and surrounding structures were obtained without intravenous contrast. Angiographic images of the head were obtained using MRA technique without contrast. COMPARISON:  Head CT from yesterday FINDINGS: MRI HEAD FINDINGS Brain: Wedge-shaped area of restricted diffusion in the upper right putamen, central corona radiata, and caudate body. Elsewhere mild chronic small vessel ischemic type signal abnormality in the cerebral white matter. Nodular focus of susceptibility artifact in the left pons, presumably old microhemorrhage. No generalized chronic hemorrhagic injury. No hydrocephalus or masslike finding. Vascular: Arterial findings below. Normal dural venous sinus flow voids. Skull and upper cervical spine: Negative Sinuses/Orbits: Negative MRA HEAD FINDINGS Symmetric carotid and vertebral arteries. Irregular appearance of the bilateral  ICA at the skull base that is usually related to artifact from bony interface. Large vessels are diffusely patent and smooth. By MRA, incomplete circle-of-Willis at anterior and  posterior communicating arteries. Overall mild atheromatous changes with bilateral distal P2/P3 segment stenoses. No evidence of aneurysm. IMPRESSION: Brain MRI: 1. Acute perforator infarct in the right basal ganglia. 2. Chronic small vessel ischemia in the cerebral white matter. Intracranial MRA: 1. No acute finding. 2. No large vessel occlusion or flow limiting stenosis. 3. Atheromatous narrowing seen in the bilateral PCA. Electronically Signed   By: Monte Fantasia M.D.   On: 11/10/2017 08:12   US Carotid Bilateral (at [facility] And Ap Only)  Result Date: 11/10/2017 CLINICAL DATA:  67 year old female with a history left lower extremity weakness. Cardiovascular risk factors include hypertension, stroke/TIA EXAM: BILATERAL CAROTID DUPLEX ULTRASOUND TECHNIQUE: Pearline Cables scale imaging, color Doppler and duplex ultrasound were performed of bilateral carotid and vertebral arteries in the neck. COMPARISON:  No prior duplex FINDINGS: Criteria: Quantification of carotid stenosis is based on velocity parameters that correlate the residual internal carotid diameter with NASCET-based stenosis levels, using the diameter of the distal internal carotid lumen as the denominator for stenosis measurement. The following velocity measurements were obtained: RIGHT ICA:  Systolic 78 cm/sec, Diastolic 27 cm/sec CCA:  80 cm/sec SYSTOLIC ICA/CCA RATIO:  1.0 ECA:  130 cm/sec LEFT ICA:  Systolic 782 cm/sec, Diastolic 25 cm/sec CCA:  93 cm/sec SYSTOLIC ICA/CCA RATIO:  0.8 ECA:  114 cm/sec Right Brachial SBP: Not acquired Left Brachial SBP: Not acquired RIGHT CAROTID ARTERY: No significant calcified disease of the right common carotid artery. Intermediate waveform maintained. Heterogeneous plaque without significant calcifications at the right carotid bifurcation. Low resistance waveform of the right ICA. No significant tortuosity. RIGHT VERTEBRAL ARTERY: Antegrade flow with low resistance waveform. LEFT CAROTID ARTERY: No significant calcified  disease of the left common carotid artery. Intermediate waveform maintained. Heterogeneous plaque at the left carotid bifurcation without significant calcifications. Low resistance waveform of the left ICA. LEFT VERTEBRAL ARTERY:  Antegrade flow with low resistance waveform. IMPRESSION: Color duplex indicates minimal heterogeneous plaque, with no hemodynamically significant stenosis by duplex criteria in the extracranial cerebrovascular circulation. Signed, Dulcy Fanny. Earleen Newport, DO [facility] Electronically Signed   By: Corrie Mckusick D.O.   On: 11/10/2017 08:59   Mr Jodene Nam Head/brain UM Cm  Result Date: 11/10/2017 CLINICAL DATA:  TIA, initial exam.  Weakness in the left leg. EXAM: MRI HEAD WITHOUT CONTRAST MRA HEAD WITHOUT CONTRAST TECHNIQUE: Multiplanar, multiecho pulse sequences of the brain and surrounding structures were obtained without intravenous contrast. Angiographic images of the head were obtained using MRA technique without contrast. COMPARISON:  Head CT from yesterday FINDINGS: MRI HEAD FINDINGS Brain: Wedge-shaped area of restricted diffusion in the upper right putamen, central corona radiata, and caudate body. Elsewhere mild chronic small vessel ischemic type signal abnormality in the cerebral white matter. Nodular focus of susceptibility artifact in the left pons, presumably old microhemorrhage. No generalized chronic hemorrhagic injury. No hydrocephalus or masslike finding. Vascular: Arterial findings below. Normal dural venous sinus flow voids. Skull and upper cervical spine: Negative Sinuses/Orbits: Negative MRA HEAD FINDINGS Symmetric carotid and vertebral arteries. Irregular appearance of the bilateral ICA at the skull base that is usually related to artifact from bony interface. Large vessels are diffusely patent and smooth. By MRA, incomplete circle-of-Willis at anterior and posterior communicating arteries. Overall mild atheromatous  changes with bilateral distal P2/P3 segment stenoses. No evidence of aneurysm. IMPRESSION:  Brain MRI: 1. Acute perforator infarct in the right basal ganglia. 2. Chronic small vessel ischemia in the cerebral white matter. Intracranial MRA: 1. No acute finding. 2. No large vessel occlusion or flow limiting stenosis. 3. Atheromatous narrowing seen in the bilateral PCA. Electronically Signed   By: Monte Fantasia M.D.   On: 11/10/2017 08:12     Microbiology: No results found for this or any previous visit (from the past 240 hour(s)).   Labs: Basic Metabolic Panel: Recent Labs  Lab 11/09/17 0903 11/09/17 0924  NA 140 142  K 3.7 3.6  CL 102 103  CO2 25  --   GLUCOSE 129* 123*  BUN 20 20  CREATININE 0.85 0.80  CALCIUM 9.8  --    Liver Function Tests: Recent Labs  Lab 11/09/17 0903  AST 29  ALT 31  ALKPHOS 83  BILITOT 0.6  PROT 7.7  ALBUMIN 4.2   No results for input(s): LIPASE, AMYLASE in the last 168 hours. No results for input(s): AMMONIA in the last 168 hours. CBC: Recent Labs  Lab 11/09/17 0903 11/09/17 0924  WBC 5.4  --   NEUTROABS 3.4  --   HGB 13.5 14.3  HCT 41.5 42.0  MCV 90.4  --   PLT 303  --    Cardiac Enzymes: No results for input(s): CKTOTAL, CKMB, CKMBINDEX, TROPONINI in the last 168 hours. BNP: Invalid input(s): POCBNP CBG: No results for input(s): GLUCAP in the last 168 hours.  Time coordinating discharge:  Greater than 30 minutes  Signed:  Orson Eva, DO [facility] Pager: [phone] 11/10/2017, 6:36 PM

## 2017-11-10 ENCOUNTER — Observation Stay (HOSPITAL_BASED_OUTPATIENT_CLINIC_OR_DEPARTMENT_OTHER): Payer: Medicare HMO

## 2017-11-10 ENCOUNTER — Observation Stay (HOSPITAL_COMMUNITY): Payer: Medicare HMO

## 2017-11-10 DIAGNOSIS — G459 Transient cerebral ischemic attack, unspecified: Secondary | ICD-10-CM

## 2017-11-10 DIAGNOSIS — I10 Essential (primary) hypertension: Secondary | ICD-10-CM | POA: Diagnosis not present

## 2017-11-10 DIAGNOSIS — E039 Hypothyroidism, unspecified: Secondary | ICD-10-CM | POA: Diagnosis not present

## 2017-11-10 DIAGNOSIS — I639 Cerebral infarction, unspecified: Secondary | ICD-10-CM

## 2017-11-10 DIAGNOSIS — E782 Mixed hyperlipidemia: Secondary | ICD-10-CM | POA: Diagnosis not present

## 2017-11-10 LAB — LIPID PANEL
CHOL/HDL RATIO: 3.8 ratio
Cholesterol: 185 mg/dL (ref 0–200)
HDL: 49 mg/dL (ref >40)
LDL CALC: 115 mg/dL — AB (ref 0–99)
Triglycerides: 107 mg/dL (ref <150)
VLDL: 21 mg/dL (ref 0–40)

## 2017-11-10 LAB — ECHOCARDIOGRAM COMPLETE
AOPV: 0.8 m/s
AV pk vel: 197 cm/s
AVAREAMEANV: 2.06 cm2
AVAREAMEANVIN: 1.22 cm2/m2
AVAREAVTI: 2.02 cm2
AVAREAVTIIND: 1.21 cm2/m2
AVCELMEANRAT: 0.81
AVG: 8 mmHg
AVPG: 16 mmHg
CHL CUP AV PEAK INDEX: 1.19
CHL CUP AV VEL: 2.05
CHL CUP STROKE VOLUME: 42 mL
DOP CAL AO MEAN VELOCITY: 123 cm/s
E/e' ratio: 8.61
EWDT: 225 ms
FS: 39 % (ref 28–44)
Height: 63 in
IV/PV OW: 1
LA diam index: 2.25 cm/m2
LA vol A4C: 27.3 ml
LA vol index: 15.1 mL/m2
LA vol: 25.6 mL
LASIZE: 38 mm
LDCA: 2.54 cm2
LEFT ATRIUM END SYS DIAM: 38 mm
LV E/e'average: 8.61
LV PW d: 10.6 mm — AB (ref 0.6–1.1)
LV SIMPSON'S DISK: 74
LV TDI E'LATERAL: 10.6
LV dias vol index: 33 mL/m2
LV dias vol: 56 mL (ref 46–106)
LV e' LATERAL: 10.6 cm/s
LV sys vol index: 9 mL/m2
LV sys vol: 14 mL
LVEEMED: 8.61
LVOT SV: 83 mL
LVOT VTI: 32.5 cm
LVOT peak VTI: 0.81 cm
LVOT peak grad rest: 10 mmHg
LVOT peak vel: 157 cm/s
LVOTD: 18 mm
MV Dec: 225
MV Peak grad: 3 mmHg
MV pk A vel: 117 m/s
MVPKEVEL: 91.3 m/s
RV LATERAL S' VELOCITY: 15.3 cm/s
TAPSE: 24.4 mm
TDI e' medial: 8.81
VTI: 40.3 cm
Valve area index: 1.21
Valve area: 2.05 cm2
Weight: 2240 oz

## 2017-11-10 LAB — HEMOGLOBIN A1C
Hgb A1c MFr Bld: 6 % — ABNORMAL HIGH (ref 4.8–5.6)
Mean Plasma Glucose: 125.5 mg/dL

## 2017-11-10 LAB — HIV ANTIBODY (ROUTINE TESTING W REFLEX): HIV SCREEN 4TH GENERATION: NONREACTIVE

## 2017-11-10 MED ORDER — AMLODIPINE BESYLATE 5 MG PO TABS: 5.0000 mg | ORAL_TABLET | Freq: Every day | ORAL | 2 refills | Status: AC

## 2017-11-10 MED ORDER — ASPIRIN 81 MG PO CHEW: 81.0000 mg | CHEWABLE_TABLET | Freq: Every day | ORAL | Status: DC

## 2017-11-10 MED ORDER — ATORVASTATIN CALCIUM 20 MG PO TABS: 20.0000 mg | ORAL_TABLET | Freq: Every day | ORAL | 2 refills | Status: AC

## 2017-11-10 MED ORDER — CLOPIDOGREL BISULFATE 75 MG PO TABS: 75.0000 mg | ORAL_TABLET | Freq: Every day | ORAL | 2 refills | Status: AC

## 2017-11-10 MED ORDER — ASPIRIN 81 MG PO CHEW: 81.0000 mg | CHEWABLE_TABLET | Freq: Every day | ORAL | 2 refills | Status: DC

## 2017-11-10 MED ORDER — AMLODIPINE BESYLATE 5 MG PO TABS
5.0000 mg | ORAL_TABLET | Freq: Every day | ORAL | Status: DC
  Administered 2017-11-10: 5 mg via ORAL
  Filled 2017-11-10: qty 1

## 2017-11-10 MED ORDER — CLOPIDOGREL BISULFATE 75 MG PO TABS
75.0000 mg | ORAL_TABLET | Freq: Every day | ORAL | Status: DC
  Administered 2017-11-10: 75 mg via ORAL
  Filled 2017-11-10: qty 1

## 2017-11-10 NOTE — Progress Notes (Signed)
OT Screen Note  Patient Details Name: Holly Callahan MRN: [id number] DOB: [birth year]   Cancelled Treatment:    Reason Eval/Treat Not Completed: OT screened, no needs identified, will sign off. Patient is currently functioning at baseline and is Independent with all ADL tasks. Strength is 5/5 in both BUE. No further OT needs at this time. Thank-you for the referral.    Ailene Ravel, OTR/L,CBIS  [phone] 11/10/2017, 8:35 AM

## 2017-11-10 NOTE — Care Management Obs Status (Signed)
[place] NOTIFICATION   Patient Details  Name: Holly Callahan MRN: [id number] Date of Birth: 12/27/1950   Medicare Observation Status Notification Given:  Yes    Sherald Barge, RN 11/10/2017, 8:29 AM

## 2017-11-10 NOTE — Progress Notes (Signed)
SLP Cancellation Note  Patient Details Name: Holly Callahan MRN: [id number] DOB: [birth year]   Cancelled treatment:       Reason Eval/Treat Not Completed: SLP screened, no needs identified, will sign off   Elvina Sidle, M.S., CCC-SLP 11/10/2017, 2:02 PM

## 2017-11-10 NOTE — Progress Notes (Signed)
Holly Callahan discharged Home per MD order.  Discharge instructions reviewed and discussed with the patient, all questions and concerns answered. Copy of instructions and scripts given to patient.  Allergies as of 11/10/2017   No Known Allergies     Medication List    STOP taking these medications   aspirin EC 81 MG tablet Replaced by:  aspirin 81 MG chewable tablet   ibuprofen 200 MG tablet Commonly known as:  ADVIL,MOTRIN     TAKE these medications   ALPRAZolam 0.5 MG tablet Commonly known as:  XANAX Take 0.25-0.5 mg by mouth 4 (four) times daily as needed for anxiety or sleep. Pt usually only takes only 1/2 tab at night   amLODipine 5 MG tablet Commonly known as:  NORVASC Take 1 tablet (5 mg total) by mouth daily.   amoxicillin 500 MG capsule Commonly known as:  AMOXIL Take 2,000 mg by mouth as directed. Take 4 capsules 1 hour prior to dental appointment   aspirin 81 MG chewable tablet Chew 1 tablet (81 mg total) by mouth daily. Start taking on:  11/11/2017 Replaces:  aspirin EC 81 MG tablet   atorvastatin 20 MG tablet Commonly known as:  LIPITOR Take 1 tablet (20 mg total) by mouth daily at 6 PM.   CALCIUM 600 + D PO Take 1 tablet by mouth 2 (two) times daily.   clopidogrel 75 MG tablet Commonly known as:  PLAVIX Take 1 tablet (75 mg total) by mouth daily. Start taking on:  11/11/2017   fish oil-omega-3 fatty acids 1000 MG capsule Take 1 g by mouth 2 (two) times daily.   hydrochlorothiazide 25 MG tablet Commonly known as:  HYDRODIURIL Take 25 mg by mouth daily.   levothyroxine 25 MCG tablet Commonly known as:  SYNTHROID, LEVOTHROID Take 25 mcg by mouth daily.   omeprazole 40 MG capsule Commonly known as:  PRILOSEC Take 40 mg by mouth daily.       Patients skin is clean, dry and intact, no evidence of skin break down. IV site discontinued and catheter remains intact. Site without signs and symptoms of complications. Dressing and pressure  applied.  Patient escorted to car by NT in a wheelchair,  no distress noted upon discharge.  Holly Callahan Holly Callahan 11/10/2017 6:01 PM

## 2017-11-10 NOTE — Evaluation (Signed)
Physical Therapy Evaluation Patient Details Name: Holly Callahan MRN: [id number] DOB: 1951-09-29 Today's Date: 11/10/2017   History of Present Illness  Holly Callahan is a 67 y.o. female with medical history of hypertension, GERD, hypothyroidism, anxiety presenting with right lower extremity "feeling rubbery" when she woke up on the morning of 11/09/2017 at 7:30 AM.  The patient states that she actually woke up around 7:00 a.m.  She went back to sleep, and subsequently woke up around 7:30 AM to go to the bathroom.  At that time, she felt like her right leg was dragging and feeling rubbery.  She stated that it lasted 10-15 minutes.  She states that she is back to normal without any new deficits since that time.  She denied falling, dizziness, or syncope.  She denied any dysarthria, dysphasia, headache, visual disturbance, other focal extremity weakness, or other dysesthesia.  The patient denies any new medications.  The patient has been in her usual state of health prior to waking up this morning, and she stated she was normal when she went to bed on the evening of 11/08/2017.  She denies any fevers, chills, headache, chest pain, shortness breath, dizziness, nausea, vomiting, diarrhea, abdominal pain, dysuria, hematuria, hematochezia, melena.  In the emergency department, the patient was afebrile hemodynamically stable saturating 99% on room air.  BMP, CBC, LFTs, UA were unremarkable.  CT of the brain shows subtle hypoattenuation in the bilateral internal capsule anteriorly concerning for possible lacunar stroke.  The patient was admitted for full stroke workup    Clinical Impression  Patient functioning at baseline for functional mobility and gait.  Plan:  Patient discharged from physical therapy to care of nursing for ambulation ad lib for length of stay.     Follow Up Recommendations No PT follow up    Equipment Recommendations  None recommended by PT    Recommendations for Other Services        Precautions / Restrictions Precautions Precautions: None Restrictions Weight Bearing Restrictions: No      Mobility  Bed Mobility Overal bed mobility: Independent                Transfers Overall transfer level: Independent                  Ambulation/Gait Ambulation/Gait assistance: Independent Ambulation Distance (Feet): 150 Feet Assistive device: None Gait Pattern/deviations: WFL(Within Functional Limits)   Gait velocity interpretation: at or above normal speed for age/gender    Stairs Stairs: Yes Stairs assistance: Modified independent (Device/Increase time) Stair Management: One rail Right;One rail Left;Alternating pattern Number of Stairs: 9 General stair comments: Patient demonstrates good return for going up/down stairs using 1 siderail without loss of balance  Wheelchair Mobility    Modified Rankin (Stroke Patients Only)       Balance Overall balance assessment: No apparent balance deficits (not formally assessed)                                           Pertinent Vitals/Pain Pain Assessment: No/denies pain    Home Living Family/patient expects to be discharged to:: Private residence Living Arrangements: Spouse/significant other Available Help at Discharge: Family Type of Home: House Home Access: Stairs to enter Entrance Stairs-Rails: Right;Left(to wide to reach both) Technical brewer of Steps: 6 Home Layout: Two level Home Equipment: Environmental consultant - 2 wheels;Shower seat;Cane - single point;Bedside commode  Prior Function Level of Independence: Independent               Hand Dominance   Dominant Hand: Right    Extremity/Trunk Assessment   Upper Extremity Assessment Upper Extremity Assessment: Defer to OT evaluation    Lower Extremity Assessment Lower Extremity Assessment: Overall WFL for tasks assessed    Cervical / Trunk Assessment Cervical / Trunk Assessment: Normal  Communication    Communication: No difficulties  Cognition Arousal/Alertness: Awake/alert Behavior During Therapy: WFL for tasks assessed/performed Overall Cognitive Status: Within Functional Limits for tasks assessed                                        General Comments      Exercises     Assessment/Plan    PT Assessment Patent does not need any further PT services  PT Problem List         PT Treatment Interventions      PT Goals (Current goals can be found in the Care Plan section)  Acute Rehab PT Goals Patient Stated Goal: return home PT Goal Formulation: With patient/family Time For Goal Achievement: 11-19-2017 Potential to Achieve Goals: Good    Frequency     Barriers to discharge        Co-evaluation               AM-PAC PT "6 Clicks" Daily Activity  Outcome Measure Difficulty turning over in bed (including adjusting bedclothes, sheets and blankets)?: None Difficulty moving from lying on back to sitting on the side of the bed? : None Difficulty sitting down on and standing up from a chair with arms (e.g., wheelchair, bedside commode, etc,.)?: None Help needed moving to and from a bed to chair (including a wheelchair)?: None Help needed walking in hospital room?: None Help needed climbing 3-5 steps with a railing? : None 6 Click Score: 24    End of Session   Activity Tolerance: Patient tolerated treatment well Patient left: in bed;with family/visitor present(seated at bedside) Nurse Communication: Mobility status PT Visit Diagnosis: Unsteadiness on feet (R26.81);Other abnormalities of gait and mobility (R26.89);Muscle weakness (generalized) (M62.81)    Time: 7026-3785 PT Time Calculation (min) (ACUTE ONLY): 16 min   Charges:   PT Evaluation $PT Eval Low Complexity: 1 Low PT Treatments $Gait Training: 8-22 mins   PT G Codes:        1:54 PM, Nov 19, 2017 Lonell Grandchild, MPT Physical Therapist with [facility] [phone]  office [phone] mobile phone

## 2017-11-10 NOTE — Progress Notes (Signed)
*  PRELIMINARY RESULTS* Echocardiogram 2D Echocardiogram has been performed.  Holly Callahan 11/10/2017, 2:49 PM

## 2017-11-10 NOTE — Progress Notes (Signed)
Pt downstairs at MRI

## 2017-11-14 DIAGNOSIS — Z6827 Body mass index (BMI) 27.0-27.9, adult: Secondary | ICD-10-CM | POA: Diagnosis not present

## 2017-11-14 DIAGNOSIS — N3281 Overactive bladder: Secondary | ICD-10-CM | POA: Diagnosis not present

## 2017-11-14 DIAGNOSIS — I639 Cerebral infarction, unspecified: Secondary | ICD-10-CM | POA: Diagnosis not present

## 2017-11-14 DIAGNOSIS — M1991 Primary osteoarthritis, unspecified site: Secondary | ICD-10-CM | POA: Diagnosis not present

## 2017-11-14 DIAGNOSIS — G894 Chronic pain syndrome: Secondary | ICD-10-CM | POA: Diagnosis not present

## 2017-11-14 DIAGNOSIS — E663 Overweight: Secondary | ICD-10-CM | POA: Diagnosis not present

## 2017-11-14 DIAGNOSIS — I10 Essential (primary) hypertension: Secondary | ICD-10-CM | POA: Diagnosis not present

## 2017-11-14 DIAGNOSIS — E063 Autoimmune thyroiditis: Secondary | ICD-10-CM | POA: Diagnosis not present

## 2017-11-14 DIAGNOSIS — Z1389 Encounter for screening for other disorder: Secondary | ICD-10-CM | POA: Diagnosis not present

## 2017-11-26 ENCOUNTER — Other Ambulatory Visit (HOSPITAL_COMMUNITY): Payer: Self-pay | Admitting: Internal Medicine

## 2017-11-26 DIAGNOSIS — E2839 Other primary ovarian failure: Secondary | ICD-10-CM

## 2017-12-10 DIAGNOSIS — Z7982 Long term (current) use of aspirin: Secondary | ICD-10-CM | POA: Diagnosis not present

## 2017-12-10 DIAGNOSIS — Z8249 Family history of ischemic heart disease and other diseases of the circulatory system: Secondary | ICD-10-CM | POA: Diagnosis not present

## 2017-12-10 DIAGNOSIS — Z823 Family history of stroke: Secondary | ICD-10-CM | POA: Diagnosis not present

## 2017-12-10 DIAGNOSIS — R69 Illness, unspecified: Secondary | ICD-10-CM | POA: Diagnosis not present

## 2017-12-10 DIAGNOSIS — I10 Essential (primary) hypertension: Secondary | ICD-10-CM | POA: Diagnosis not present

## 2017-12-10 DIAGNOSIS — F419 Anxiety disorder, unspecified: Secondary | ICD-10-CM | POA: Diagnosis not present

## 2017-12-10 DIAGNOSIS — K219 Gastro-esophageal reflux disease without esophagitis: Secondary | ICD-10-CM | POA: Diagnosis not present

## 2017-12-10 DIAGNOSIS — Z7902 Long term (current) use of antithrombotics/antiplatelets: Secondary | ICD-10-CM | POA: Diagnosis not present

## 2017-12-10 DIAGNOSIS — E039 Hypothyroidism, unspecified: Secondary | ICD-10-CM | POA: Diagnosis not present

## 2017-12-10 DIAGNOSIS — Z8673 Personal history of transient ischemic attack (TIA), and cerebral infarction without residual deficits: Secondary | ICD-10-CM | POA: Diagnosis not present

## 2017-12-11 ENCOUNTER — Ambulatory Visit (HOSPITAL_COMMUNITY)
Admission: RE | Admit: 2017-12-11 | Discharge: 2017-12-11 | Disposition: A | Discharge status: Home / Self Care | Payer: Medicare HMO | Source: Ambulatory Visit | Attending: Internal Medicine | Admitting: Internal Medicine

## 2017-12-11 DIAGNOSIS — M85852 Other specified disorders of bone density and structure, left thigh: Secondary | ICD-10-CM | POA: Insufficient documentation

## 2017-12-11 DIAGNOSIS — E2839 Other primary ovarian failure: Secondary | ICD-10-CM | POA: Insufficient documentation

## 2017-12-11 DIAGNOSIS — Z780 Asymptomatic menopausal state: Secondary | ICD-10-CM | POA: Diagnosis not present

## 2017-12-11 DIAGNOSIS — M8589 Other specified disorders of bone density and structure, multiple sites: Secondary | ICD-10-CM | POA: Diagnosis not present

## 2018-01-24 ENCOUNTER — Encounter (HOSPITAL_COMMUNITY): Payer: Self-pay | Admitting: *Deleted

## 2018-01-24 ENCOUNTER — Emergency Department (HOSPITAL_COMMUNITY): Payer: Medicare HMO

## 2018-01-24 ENCOUNTER — Emergency Department (HOSPITAL_COMMUNITY)
Admission: EM | Admit: 2018-01-24 | Discharge: 2018-01-25 | Disposition: A | Discharge status: Home / Self Care | Payer: Medicare HMO | Attending: Emergency Medicine | Admitting: Emergency Medicine

## 2018-01-24 DIAGNOSIS — S93401A Sprain of unspecified ligament of right ankle, initial encounter: Secondary | ICD-10-CM

## 2018-01-24 DIAGNOSIS — I10 Essential (primary) hypertension: Secondary | ICD-10-CM | POA: Diagnosis not present

## 2018-01-24 DIAGNOSIS — Z79899 Other long term (current) drug therapy: Secondary | ICD-10-CM | POA: Insufficient documentation

## 2018-01-24 DIAGNOSIS — Y998 Other external cause status: Secondary | ICD-10-CM | POA: Diagnosis not present

## 2018-01-24 DIAGNOSIS — Z96651 Presence of right artificial knee joint: Secondary | ICD-10-CM | POA: Insufficient documentation

## 2018-01-24 DIAGNOSIS — Y939 Activity, unspecified: Secondary | ICD-10-CM | POA: Diagnosis not present

## 2018-01-24 DIAGNOSIS — X58XXXA Exposure to other specified factors, initial encounter: Secondary | ICD-10-CM | POA: Diagnosis not present

## 2018-01-24 DIAGNOSIS — E039 Hypothyroidism, unspecified: Secondary | ICD-10-CM | POA: Insufficient documentation

## 2018-01-24 DIAGNOSIS — Y929 Unspecified place or not applicable: Secondary | ICD-10-CM | POA: Diagnosis not present

## 2018-01-24 DIAGNOSIS — M7731 Calcaneal spur, right foot: Secondary | ICD-10-CM | POA: Diagnosis not present

## 2018-01-24 DIAGNOSIS — Z7982 Long term (current) use of aspirin: Secondary | ICD-10-CM | POA: Diagnosis not present

## 2018-01-24 DIAGNOSIS — M25571 Pain in right ankle and joints of right foot: Secondary | ICD-10-CM | POA: Diagnosis not present

## 2018-01-24 DIAGNOSIS — Z8673 Personal history of transient ischemic attack (TIA), and cerebral infarction without residual deficits: Secondary | ICD-10-CM | POA: Diagnosis not present

## 2018-01-24 DIAGNOSIS — M7989 Other specified soft tissue disorders: Secondary | ICD-10-CM | POA: Diagnosis not present

## 2018-01-24 DIAGNOSIS — S99911A Unspecified injury of right ankle, initial encounter: Secondary | ICD-10-CM | POA: Diagnosis present

## 2018-01-24 NOTE — ED Triage Notes (Signed)
Pt with right foot pain started today, pt denies injury to foot. Pt able to put some weight on it.

## 2018-01-25 DIAGNOSIS — Z7982 Long term (current) use of aspirin: Secondary | ICD-10-CM | POA: Diagnosis not present

## 2018-01-25 DIAGNOSIS — E039 Hypothyroidism, unspecified: Secondary | ICD-10-CM | POA: Diagnosis not present

## 2018-01-25 DIAGNOSIS — I10 Essential (primary) hypertension: Secondary | ICD-10-CM | POA: Diagnosis not present

## 2018-01-25 DIAGNOSIS — X58XXXA Exposure to other specified factors, initial encounter: Secondary | ICD-10-CM | POA: Diagnosis not present

## 2018-01-25 DIAGNOSIS — Z96651 Presence of right artificial knee joint: Secondary | ICD-10-CM | POA: Diagnosis not present

## 2018-01-25 DIAGNOSIS — Z79899 Other long term (current) drug therapy: Secondary | ICD-10-CM | POA: Diagnosis not present

## 2018-01-25 DIAGNOSIS — Z8673 Personal history of transient ischemic attack (TIA), and cerebral infarction without residual deficits: Secondary | ICD-10-CM | POA: Diagnosis not present

## 2018-01-25 DIAGNOSIS — S93401A Sprain of unspecified ligament of right ankle, initial encounter: Secondary | ICD-10-CM | POA: Diagnosis not present

## 2018-01-25 MED ORDER — TRAMADOL HCL 50 MG PO TABS: 50.0000 mg | ORAL_TABLET | Freq: Four times a day (QID) | ORAL | 0 refills | Status: DC | PRN

## 2018-01-25 MED ORDER — TRAMADOL HCL 50 MG PO TABS
100.0000 mg | ORAL_TABLET | Freq: Once | ORAL | Status: AC
  Administered 2018-01-25: 100 mg via ORAL
  Filled 2018-01-25: qty 2

## 2018-01-25 MED ORDER — ONDANSETRON HCL 4 MG PO TABS
4.0000 mg | ORAL_TABLET | Freq: Once | ORAL | Status: AC
  Administered 2018-01-25: 4 mg via ORAL
  Filled 2018-01-25: qty 1

## 2018-01-25 MED ORDER — MELOXICAM 7.5 MG PO TABS: 7.5000 mg | ORAL_TABLET | Freq: Every day | ORAL | 0 refills | Status: DC

## 2018-01-25 NOTE — ED Provider Notes (Signed)
[facility] EMERGENCY DEPARTMENT Provider Note   CSN: [id number] Arrival date & time: 01/24/18  2216     History   Chief Complaint Chief Complaint  Patient presents with  . Foot Pain    HPI Holly Callahan is a 67 y.o. female.  Patient is a 67 year old female who presents to the emergency department with complaint of right foot and ankle pain.  The patient states that she has some swelling of her feet from time to time.  Today she says she has been on her feet all day long because of a family event for Easter.  The patient denies any recent injury or trauma to the foot or ankle.  Pain is better with rest, is worse with standing or attempting to walk.  She presents now for assistance with this issue.     Past Medical History:  Diagnosis Date  . Anxiety   . Arthritis   . GERD (gastroesophageal reflux disease)   . Hypertension   . Hypothyroidism   . Numbness    hands bilat secondary to carpal tunnel   . PONV (postoperative nausea and vomiting)     Patient Active Problem List   Diagnosis Date Noted  . Acute ischemic stroke ([place]) 11/10/2017  . TIA (transient ischemic attack) 11/09/2017  . Essential hypertension 11/09/2017  . Hyperlipidemia 11/09/2017  . Hypothyroidism 11/09/2017  . History of total knee arthroplasty 10/04/2015    Past Surgical History:  Procedure Laterality Date  . CESAREAN SECTION  1988  . CHOLECYSTECTOMY    . COLONOSCOPY  10/28/2011   Procedure: COLONOSCOPY;  Surgeon: Daneil Dolin, MD;  Location: [facility] ENDO SUITE;  Service: Endoscopy;  Laterality: N/A;  8:30 AM  . TOTAL KNEE ARTHROPLASTY Right 10/04/2015   Procedure: RIGHT TOTAL KNEE ARTHROPLASTY;  Surgeon: Latanya Maudlin, MD;  Location: [facility] ORS;  Service: Orthopedics;  Laterality: Right;     OB History   None      Home Medications    Prior to Admission medications   Medication Sig Start Date End Date Taking? Authorizing Provider  ALPRAZolam Duanne Moron) 0.5 MG tablet Take 0.25-0.5 mg by mouth  4 (four) times daily as needed for anxiety or sleep. Pt usually only takes only 1/2 tab at night   Yes [provider]  amLODipine (NORVASC) 5 MG tablet Take 1 tablet (5 mg total) by mouth daily. 11/10/17  Yes TatShanon Brow, MD  aspirin 81 MG chewable tablet Chew 1 tablet (81 mg total) by mouth daily. 11/11/17  Yes Tat, Shanon Brow, MD  atorvastatin (LIPITOR) 20 MG tablet Take 1 tablet (20 mg total) by mouth daily at 6 PM. 11/10/17  Yes Tat, Shanon Brow, MD  Calcium Carb-Cholecalciferol (CALCIUM 600 + D PO) Take 1 tablet by mouth 2 (two) times daily.   Yes [provider]  clopidogrel (PLAVIX) 75 MG tablet Take 1 tablet (75 mg total) by mouth daily. 11/11/17  Yes Tat, Shanon Brow, MD  fish oil-omega-3 fatty acids 1000 MG capsule Take 1 g by mouth 2 (two) times daily.     Yes [provider]  hydrochlorothiazide (HYDRODIURIL) 25 MG tablet Take 25 mg by mouth daily.     Yes [provider]  levothyroxine (SYNTHROID, LEVOTHROID) 25 MCG tablet Take 25 mcg by mouth daily.     Yes [provider]  omeprazole (PRILOSEC) 40 MG capsule Take 40 mg by mouth daily.   Yes [provider]  amoxicillin (AMOXIL) 500 MG capsule Take 2,000 mg by mouth as directed. Take  4 capsules 1 hour prior to dental appointment    [provider]    Family History History reviewed. No pertinent family history.  Social History Social History   Tobacco Use  . Smoking status: Never Smoker  . Smokeless tobacco: Never Used  Substance Use Topics  . Alcohol use: No  . Drug use: No     Allergies   Patient has no known allergies.   Review of Systems Review of Systems  Constitutional: Negative for activity change.       All ROS Neg except as noted in HPI  HENT: Negative for nosebleeds.   Eyes: Negative for photophobia and discharge.  Respiratory: Negative for cough, shortness of breath and wheezing.   Cardiovascular: Negative for chest pain and palpitations.  Gastrointestinal:  Negative for abdominal pain and blood in stool.  Genitourinary: Negative for dysuria, frequency and hematuria.  Musculoskeletal: Positive for arthralgias. Negative for back pain and neck pain.  Skin: Negative.   Neurological: Negative for dizziness, seizures and speech difficulty.  Psychiatric/Behavioral: Negative for confusion and hallucinations.     Physical Exam Updated Vital Signs BP (!) 137/99 (BP Location: Right Arm)   Pulse 80   Temp 98.6 F (37 C) (Oral)   Resp 17   Ht 5\' 2"  (1.575 m)   Wt 68 kg (150 lb)   SpO2 96%   BMI 27.44 kg/m   Physical Exam  Constitutional: She is oriented to person, place, and time. She appears well-developed and well-nourished.  Non-toxic appearance.  HENT:  Head: Normocephalic.  Right Ear: Tympanic membrane and external ear normal.  Left Ear: Tympanic membrane and external ear normal.  Eyes: Pupils are equal, round, and reactive to light. EOM and lids are normal.  Neck: Normal range of motion. Neck supple. Carotid bruit is not present.  Cardiovascular: Normal rate, regular rhythm, normal heart sounds, intact distal pulses and normal pulses.  Pulmonary/Chest: Breath sounds normal. No respiratory distress.  Abdominal: Soft. Bowel sounds are normal. There is no tenderness. There is no guarding.  Musculoskeletal: Normal range of motion. She exhibits tenderness.       Right ankle: Tenderness. Medial malleolus tenderness found.       Feet:  There is good range of motion of the right knee, however with crepitus present.  There is no edema of the right calf.  The Achilles tendon on the right is intact.  There is mild swelling about the foot and ankle.  The patient states however that she has swelling of her foot and ankle usually at night before she goes to bed.  The patient has pain of the anterior medial malleolus  Lymphadenopathy:       Head (right side): No submandibular adenopathy present.       Head (left side): No submandibular adenopathy  present.    She has no cervical adenopathy.  Neurological: She is alert and oriented to person, place, and time. She has normal strength. No cranial nerve deficit or sensory deficit.  Skin: Skin is warm and dry.  Psychiatric: She has a normal mood and affect. Her speech is normal.  Nursing note and vitals reviewed.    ED Treatments / Results  Labs (all labs ordered are listed, but only abnormal results are displayed) Labs Reviewed - No data to display  EKG None  Radiology Dg Ankle Complete Right  Result Date: 01/24/2018 CLINICAL DATA:  Ankle pain EXAM: RIGHT ANKLE - COMPLETE 3+ VIEW COMPARISON:  12/24/2012 FINDINGS: Diffuse soft tissue swelling. No  fracture or malalignment. The ankle mortise is symmetric. Moderate plantar calcaneal spur. IMPRESSION: Soft tissue swelling.  No acute osseous abnormality. Electronically Signed   By: Donavan Foil M.D.   On: 01/24/2018 23:03   Dg Foot Complete Right  Result Date: 01/24/2018 CLINICAL DATA:  Foot and ankle pain EXAM: RIGHT FOOT COMPLETE - 3+ VIEW COMPARISON:  01/01/2013 FINDINGS: No acute displaced fracture or malalignment. Moderate plantar calcaneal spur. Mild degenerative changes at the first MTP joint. IMPRESSION: No acute osseous abnormality. Electronically Signed   By: Donavan Foil M.D.   On: 01/24/2018 23:03    Procedures Procedures (including critical care time)  Medications Ordered in ED Medications  traMADol (ULTRAM) tablet 100 mg (has no administration in time range)  ondansetron (ZOFRAN) tablet 4 mg (has no administration in time range)     Initial Impression / Assessment and Plan / ED Course  I have reviewed the triage vital signs and the nursing notes.  Pertinent labs & imaging results that were available during my care of the patient were reviewed by me and considered in my medical decision making (see chart for details).       Final Clinical Impressions(s) / ED Diagnoses  MDM  Blood pressure is 137/99,  otherwise vital signs are within normal limits.  The pulse oximetry is 96% on room air.  Within normal limits by my interpretation.  X-ray of the right foot and ankle show degenerative changes, but no fracture, no dislocation.  There are no neurovascular deficits appreciated.  I have instructed the patient that this is probably a strain of her ankle area.  The patient is fitted with an ankle stirrup splint.  She will use Tylenol extra strength for mild pain, she will use Ultram for more severe pain.  The patient will follow up with Dr. Riley Kill in the office..   Final diagnoses:  Sprain of right ankle, unspecified ligament, initial encounter    ED Discharge Orders    None       Lily Kocher, PA-C 01/25/18 0033    Ezequiel Essex, MD 01/25/18 [phone]

## 2018-01-25 NOTE — ED Notes (Signed)
Pt wheeled to waiting room. Pt verbalized understanding of discharge instructions.   

## 2018-01-25 NOTE — Discharge Instructions (Addendum)
Please elevate your foot and ankle when you are at rest.  Also elevated when you are in bed.  Please use your ankle splint when you are up and about.  You do not need to sleep in this device.  Use this device over the next 7 or 8 days.  Please use meloxicam once daily with a meal.  Please use Tylenol extra strength for mild pain, please use Ultram for more severe pain.  Ultram may cause drowsiness, please use this medication with caution.  Please see Dr. Riley Kill for additional evaluation if not improving.

## 2018-02-04 ENCOUNTER — Other Ambulatory Visit (HOSPITAL_COMMUNITY): Payer: Self-pay | Admitting: Internal Medicine

## 2018-02-04 DIAGNOSIS — Z79899 Other long term (current) drug therapy: Secondary | ICD-10-CM | POA: Diagnosis not present

## 2018-02-04 DIAGNOSIS — R7309 Other abnormal glucose: Secondary | ICD-10-CM | POA: Diagnosis not present

## 2018-02-04 DIAGNOSIS — H6121 Impacted cerumen, right ear: Secondary | ICD-10-CM | POA: Diagnosis not present

## 2018-02-04 DIAGNOSIS — R69 Illness, unspecified: Secondary | ICD-10-CM | POA: Diagnosis not present

## 2018-02-04 DIAGNOSIS — Z6827 Body mass index (BMI) 27.0-27.9, adult: Secondary | ICD-10-CM | POA: Diagnosis not present

## 2018-02-04 DIAGNOSIS — Z1239 Encounter for other screening for malignant neoplasm of breast: Secondary | ICD-10-CM

## 2018-02-04 DIAGNOSIS — I10 Essential (primary) hypertension: Secondary | ICD-10-CM | POA: Diagnosis not present

## 2018-02-04 DIAGNOSIS — E039 Hypothyroidism, unspecified: Secondary | ICD-10-CM | POA: Diagnosis not present

## 2018-02-04 DIAGNOSIS — M1991 Primary osteoarthritis, unspecified site: Secondary | ICD-10-CM | POA: Diagnosis not present

## 2018-02-17 DIAGNOSIS — L03116 Cellulitis of left lower limb: Secondary | ICD-10-CM | POA: Diagnosis not present

## 2018-03-27 ENCOUNTER — Ambulatory Visit (HOSPITAL_COMMUNITY)
Admission: RE | Admit: 2018-03-27 | Discharge: 2018-03-27 | Disposition: A | Discharge status: Home / Self Care | Payer: Medicare HMO | Source: Ambulatory Visit | Attending: Internal Medicine | Admitting: Internal Medicine

## 2018-03-27 DIAGNOSIS — Z1239 Encounter for other screening for malignant neoplasm of breast: Secondary | ICD-10-CM

## 2018-03-27 DIAGNOSIS — Z1231 Encounter for screening mammogram for malignant neoplasm of breast: Secondary | ICD-10-CM | POA: Diagnosis not present

## 2018-06-24 DIAGNOSIS — H5200 Hypermetropia, unspecified eye: Secondary | ICD-10-CM | POA: Diagnosis not present

## 2018-06-24 DIAGNOSIS — Z0100 Encounter for examination of eyes and vision without abnormal findings: Secondary | ICD-10-CM | POA: Diagnosis not present

## 2018-06-30 DIAGNOSIS — R69 Illness, unspecified: Secondary | ICD-10-CM | POA: Diagnosis not present

## 2018-08-15 DIAGNOSIS — M25561 Pain in right knee: Secondary | ICD-10-CM | POA: Diagnosis not present

## 2018-09-16 DIAGNOSIS — J019 Acute sinusitis, unspecified: Secondary | ICD-10-CM | POA: Diagnosis not present

## 2018-09-16 DIAGNOSIS — J029 Acute pharyngitis, unspecified: Secondary | ICD-10-CM | POA: Diagnosis not present

## 2018-09-16 DIAGNOSIS — R0981 Nasal congestion: Secondary | ICD-10-CM | POA: Diagnosis not present

## 2018-09-16 DIAGNOSIS — Z6828 Body mass index (BMI) 28.0-28.9, adult: Secondary | ICD-10-CM | POA: Diagnosis not present

## 2018-09-16 DIAGNOSIS — N342 Other urethritis: Secondary | ICD-10-CM | POA: Diagnosis not present

## 2018-12-09 ENCOUNTER — Ambulatory Visit (INDEPENDENT_AMBULATORY_CARE_PROVIDER_SITE_OTHER): Payer: Medicare HMO | Admitting: Adult Health

## 2018-12-09 ENCOUNTER — Other Ambulatory Visit (HOSPITAL_COMMUNITY)
Admission: RE | Admit: 2018-12-09 | Discharge: 2018-12-09 | Disposition: A | Discharge status: Home / Self Care | Payer: Medicare HMO | Source: Ambulatory Visit | Attending: Adult Health | Admitting: Adult Health

## 2018-12-09 ENCOUNTER — Encounter: Payer: Self-pay | Admitting: Adult Health

## 2018-12-09 VITALS — BP 139/78 | HR 60 | Ht 61.0 in | Wt 164.0 lb

## 2018-12-09 DIAGNOSIS — Z01419 Encounter for gynecological examination (general) (routine) without abnormal findings: Secondary | ICD-10-CM | POA: Diagnosis present

## 2018-12-09 DIAGNOSIS — R319 Hematuria, unspecified: Secondary | ICD-10-CM | POA: Diagnosis not present

## 2018-12-09 DIAGNOSIS — R69 Illness, unspecified: Secondary | ICD-10-CM | POA: Diagnosis not present

## 2018-12-09 DIAGNOSIS — Z1211 Encounter for screening for malignant neoplasm of colon: Secondary | ICD-10-CM | POA: Diagnosis not present

## 2018-12-09 DIAGNOSIS — Z8619 Personal history of other infectious and parasitic diseases: Secondary | ICD-10-CM | POA: Insufficient documentation

## 2018-12-09 DIAGNOSIS — Z87440 Personal history of urinary (tract) infections: Secondary | ICD-10-CM | POA: Insufficient documentation

## 2018-12-09 DIAGNOSIS — R4581 Low self-esteem: Secondary | ICD-10-CM | POA: Insufficient documentation

## 2018-12-09 DIAGNOSIS — Z1212 Encounter for screening for malignant neoplasm of rectum: Secondary | ICD-10-CM | POA: Diagnosis not present

## 2018-12-09 DIAGNOSIS — Z1151 Encounter for screening for human papillomavirus (HPV): Secondary | ICD-10-CM | POA: Diagnosis not present

## 2018-12-09 DIAGNOSIS — L900 Lichen sclerosus et atrophicus: Secondary | ICD-10-CM | POA: Insufficient documentation

## 2018-12-09 LAB — POCT URINALYSIS DIPSTICK
GLUCOSE UA: NEGATIVE
KETONES UA: NEGATIVE
Nitrite, UA: NEGATIVE
PROTEIN UA: NEGATIVE

## 2018-12-09 LAB — HEMOCCULT GUIAC POC 1CARD (OFFICE): FECAL OCCULT BLD: NEGATIVE

## 2018-12-09 MED ORDER — VALACYCLOVIR HCL 1 G PO TABS: 1000.0000 mg | ORAL_TABLET | Freq: Every day | ORAL | 6 refills | Status: DC

## 2018-12-09 MED ORDER — SERTRALINE HCL 25 MG PO TABS: 25.0000 mg | ORAL_TABLET | Freq: Every day | ORAL | 6 refills | Status: DC

## 2018-12-09 MED ORDER — CLOBETASOL PROPIONATE 0.05 % EX OINT: TOPICAL_OINTMENT | CUTANEOUS | 3 refills | Status: DC

## 2018-12-09 NOTE — Progress Notes (Signed)
Patient ID: Holly Callahan, female   DOB: 1950/10/19, 68 y.o.   MRN: [id number] History of Present Illness: Holly Callahan is a 68 year old white female, married, PM in for well woman gyn exam and pap. PCP is Holly Callahan.   Current Medications, Allergies, Past Medical History, Past Surgical History, Family History and Social History were reviewed in [employer] record.     Review of Systems: Patient denies any headaches, hearing loss, fatigue, blurred vision, shortness of breath, chest pain, abdominal pain, problems with bowel movements, urination, or intercourse(uncomfortable at times). No joint pain or mood swings. Has recurrent HSV and has itching in vaginal area Has low self esteem, as aging    Physical Exam:BP 139/78 (BP Location: Left Arm, Patient Position: Sitting, Cuff Size: Normal)   Pulse 60   Ht 5\' 1"  (1.549 m)   Wt 164 lb (74.4 kg)   BMI 30.99 kg/m urine dipstick: trace leuks and trace blood. General:  Well developed, well nourished, no acute distress Skin:  Warm and dry Neck:  Midline trachea, normal thyroid, good ROM, no lymphadenopathy,no carotid bruits heard Lungs; Clear to auscultation bilaterally Breast:  No dominant palpable mass, retraction, or nipple discharge Cardiovascular: Regular rate and rhythm Abdomen:  Soft, non tender, no hepatosplenomegaly Pelvic:  External genitalia is normal in appearance, no lesions.  The vagina is pale with loss of moisture and rugae.Has thin red area at introitus,esp on right.  Urethra has no lesions or masses. The cervix is smooth, pap with HPV performed.  Uterus is felt to be normal size, shape, and contour.  No adnexal masses or tenderness noted.Bladder is non tender, no masses felt. Rectal: Good sphincter tone, no polyps, or hemorrhoids felt.  Hemoccult negative. Extremities/musculoskeletal:  No swelling or varicosities noted, no clubbing or cyanosis Psych:  No mood changes, alert and cooperative,seems happy Fall risk is  low. PHQ 9 score 3, denies being suicidal, take 1/2 xanax most days   Impression: 1. Encounter for gynecological examination with Papanicolaou smear of cervix   2. Hematuria, unspecified type   3. History of UTI   4. Screening for colorectal cancer   5. History of herpes simplex infection   6. Lichen sclerosus et atrophicus   7. Low self esteem       Plan:  Meds ordered this encounter  Medications  . valACYclovir (VALTREX) 1000 MG tablet    Sig: Take 1 tablet (1,000 mg total) by mouth daily.    Dispense:  30 tablet    Refill:  6    Order Specific Question:   Supervising Provider    Answer:   Elonda Husky, LUTHER H [[id number]]  . clobetasol ointment (TEMOVATE) 0.05 %    Sig: Apply bid to affected area for 2 weeks, then 2-3 x weekly    Dispense:  45 g    Refill:  3    Order Specific Question:   Supervising Provider    Answer:   Elonda Husky, LUTHER H [[id number]]  . sertraline (ZOLOFT) 25 MG tablet    Sig: Take 1 tablet (25 mg total) by mouth daily.    Dispense:  30 tablet    Refill:  6    Order Specific Question:   Supervising Provider    Answer:   Tania Ade H [[id number]]  F/U in 3 weeks Physical in 2 years Mammogram yearly Colonoscopy per GI Labs per PCP  Use astro glide or even olive oil with sex.

## 2018-12-10 LAB — URINALYSIS, ROUTINE W REFLEX MICROSCOPIC
BILIRUBIN UA: NEGATIVE
Glucose, UA: NEGATIVE
KETONES UA: NEGATIVE
Leukocytes, UA: NEGATIVE
Nitrite, UA: NEGATIVE
PH UA: 6.5 (ref 5.0–7.5)
PROTEIN UA: NEGATIVE
RBC UA: NEGATIVE
Specific Gravity, UA: 1.016 (ref 1.005–1.030)
UUROB: 0.2 mg/dL (ref 0.2–1.0)

## 2018-12-11 ENCOUNTER — Telehealth: Payer: Self-pay | Admitting: Adult Health

## 2018-12-11 LAB — CYTOLOGY - PAP
Diagnosis: NEGATIVE
HPV (WINDOPATH): NOT DETECTED

## 2018-12-11 LAB — URINE CULTURE

## 2018-12-11 MED ORDER — PENICILLIN V POTASSIUM 500 MG PO TABS: 500.0000 mg | ORAL_TABLET | Freq: Four times a day (QID) | ORAL | 0 refills | Status: DC

## 2018-12-11 NOTE — Telephone Encounter (Signed)
Called patient to see which medication she needed. No answer, left voicemail to call back with information about what medication and which pharmacy.

## 2018-12-11 NOTE — Telephone Encounter (Signed)
Pt aware that that urine +GBS, will rx Veetid.

## 2018-12-11 NOTE — Telephone Encounter (Signed)
Patient called stating that she would like a refill of her medication. Pt states that she was here 12/09/2018 and forgot to ask jennifer

## 2018-12-21 DIAGNOSIS — I10 Essential (primary) hypertension: Secondary | ICD-10-CM | POA: Diagnosis not present

## 2018-12-21 DIAGNOSIS — Z6828 Body mass index (BMI) 28.0-28.9, adult: Secondary | ICD-10-CM | POA: Diagnosis not present

## 2018-12-21 DIAGNOSIS — R69 Illness, unspecified: Secondary | ICD-10-CM | POA: Diagnosis not present

## 2018-12-21 DIAGNOSIS — E663 Overweight: Secondary | ICD-10-CM | POA: Diagnosis not present

## 2018-12-21 DIAGNOSIS — Z0001 Encounter for general adult medical examination with abnormal findings: Secondary | ICD-10-CM | POA: Diagnosis not present

## 2018-12-30 ENCOUNTER — Ambulatory Visit: Payer: Medicare HMO | Admitting: Adult Health

## 2018-12-31 ENCOUNTER — Other Ambulatory Visit: Payer: Self-pay | Admitting: Adult Health

## 2019-01-06 ENCOUNTER — Telehealth: Payer: Self-pay | Admitting: *Deleted

## 2019-01-06 NOTE — Telephone Encounter (Signed)
Patient informed that we are not allowing visitors or children to come to appointments at this time. Patient denies any contact with anyone suspected or confirmed of having COVID-19. Pt denies fever, cough, sob, muscle pain, diarrhea, rash, vomiting, abdominal pain, red eye, weakness, bruising or bleeding, joint pain or severe headache.  

## 2019-01-06 NOTE — Telephone Encounter (Signed)
Erroneous encounter

## 2019-01-07 ENCOUNTER — Ambulatory Visit: Payer: Medicare HMO | Admitting: Adult Health

## 2019-01-07 ENCOUNTER — Other Ambulatory Visit: Payer: Self-pay

## 2019-01-07 ENCOUNTER — Encounter: Payer: Self-pay | Admitting: Adult Health

## 2019-01-07 VITALS — BP 132/83 | HR 62 | Temp 97.9°F | Ht 61.0 in | Wt 164.0 lb

## 2019-01-07 DIAGNOSIS — L900 Lichen sclerosus et atrophicus: Secondary | ICD-10-CM

## 2019-01-07 NOTE — Progress Notes (Signed)
Patient ID: Holly Callahan, female   DOB: 1951-01-05, 68 y.o.   MRN: [id number] History of Present Illness: Holly Callahan is a 68 year old white female back in follow up on itching and burning,and it has stopped. PCP is Dr Gerarda Fraction.   Current Medications, Allergies, Past Medical History, Past Surgical History, Family History and Social History were reviewed in [facility] record.     Review of Systems: Has no itching or burning now     Physical Exam:BP 132/83 (BP Location: Left Arm, Patient Position: Sitting, Cuff Size: Normal)   Pulse 62   Temp 97.9 F (36.6 C)   Ht 5\' 1"  (1.549 m)   Wt 164 lb (74.4 kg)   BMI 30.99 kg/m  General:  Well developed, well nourished, no acute distress Skin:  Warm and dry Pelvic:  External genitalia is normal in appearance, no lesions.Does have small hair bump left thigh.  The vagina is pale and the area on right of introitus healed. Psych:  No mood changes, alert and cooperative,seems happy She says she took 1 zoloft and then read handout on side effects and stopped, but kept them just in case, she needs to restart.  Continue temovate. Examination chaperoned by Diona Fanti CMA.    Impression: Lichen sclerosus, continue temovate 2-3 x weekly    Plan: Follow up in 3 months Continue temovate cream

## 2019-04-06 DIAGNOSIS — M779 Enthesopathy, unspecified: Secondary | ICD-10-CM | POA: Diagnosis not present

## 2019-04-06 DIAGNOSIS — Z79899 Other long term (current) drug therapy: Secondary | ICD-10-CM | POA: Diagnosis not present

## 2019-04-06 DIAGNOSIS — E663 Overweight: Secondary | ICD-10-CM | POA: Diagnosis not present

## 2019-04-06 DIAGNOSIS — Z1389 Encounter for screening for other disorder: Secondary | ICD-10-CM | POA: Diagnosis not present

## 2019-04-06 DIAGNOSIS — Z13220 Encounter for screening for lipoid disorders: Secondary | ICD-10-CM | POA: Diagnosis not present

## 2019-04-06 DIAGNOSIS — E063 Autoimmune thyroiditis: Secondary | ICD-10-CM | POA: Diagnosis not present

## 2019-04-06 DIAGNOSIS — R7309 Other abnormal glucose: Secondary | ICD-10-CM | POA: Diagnosis not present

## 2019-04-06 DIAGNOSIS — Z23 Encounter for immunization: Secondary | ICD-10-CM | POA: Diagnosis not present

## 2019-04-06 DIAGNOSIS — Z6828 Body mass index (BMI) 28.0-28.9, adult: Secondary | ICD-10-CM | POA: Diagnosis not present

## 2019-04-06 DIAGNOSIS — E039 Hypothyroidism, unspecified: Secondary | ICD-10-CM | POA: Diagnosis not present

## 2019-04-06 DIAGNOSIS — I10 Essential (primary) hypertension: Secondary | ICD-10-CM | POA: Diagnosis not present

## 2019-04-08 ENCOUNTER — Ambulatory Visit: Payer: Medicare HMO | Admitting: Adult Health

## 2019-04-12 ENCOUNTER — Telehealth: Payer: Self-pay | Admitting: *Deleted

## 2019-04-12 IMAGING — DX DG FOOT COMPLETE 3+V*R*
3 series · 3 of 3 positions shown · non-contrast
Comparison: 01/01/2013

CLINICAL DATA: Foot and ankle pain

EXAM:
RIGHT FOOT COMPLETE - 3+ VIEW

[foot ap]
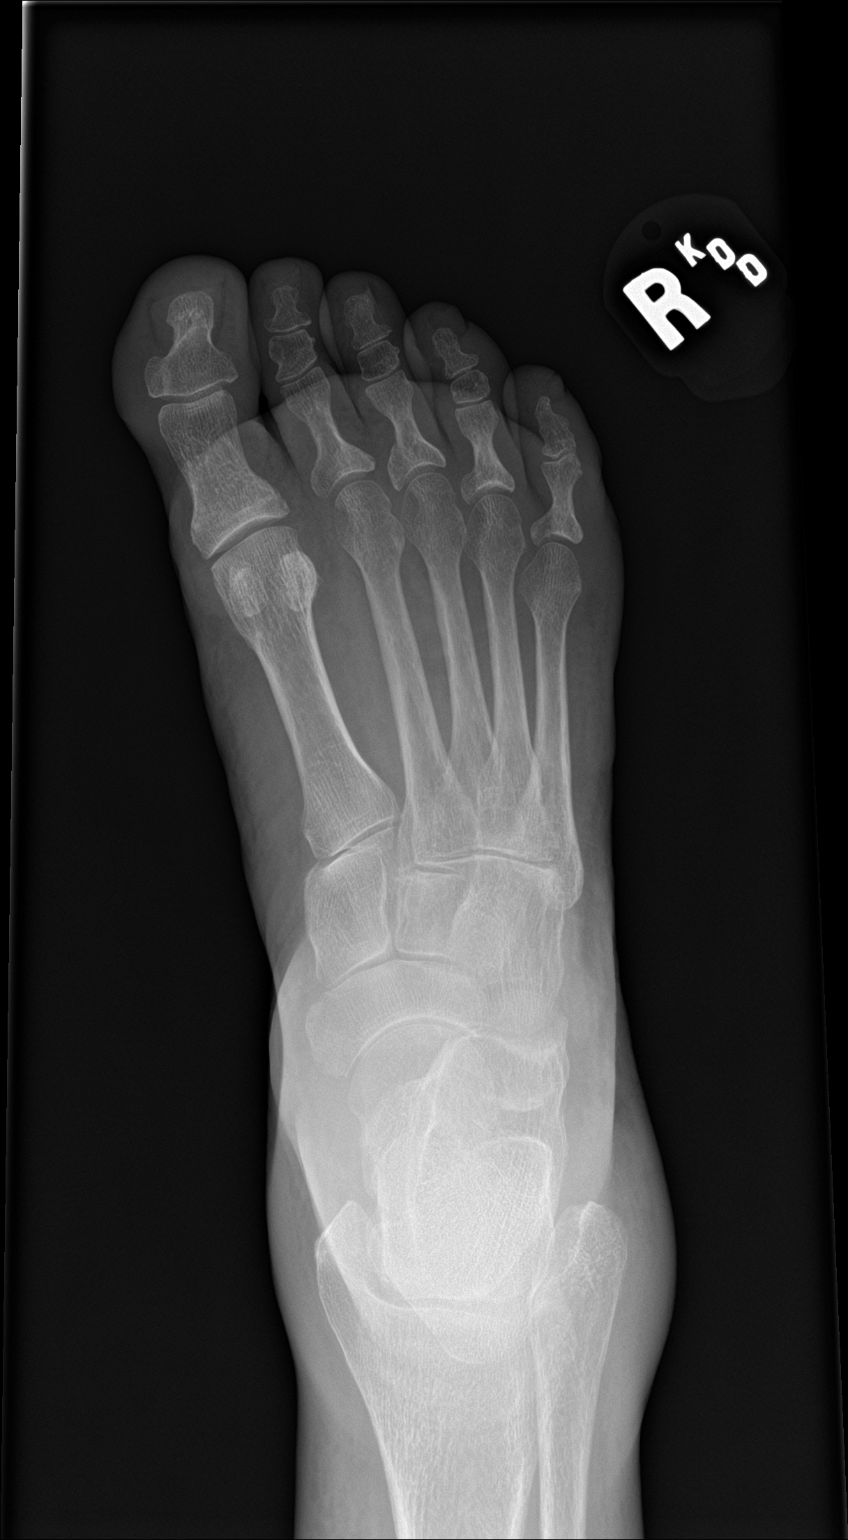

[foot obl]
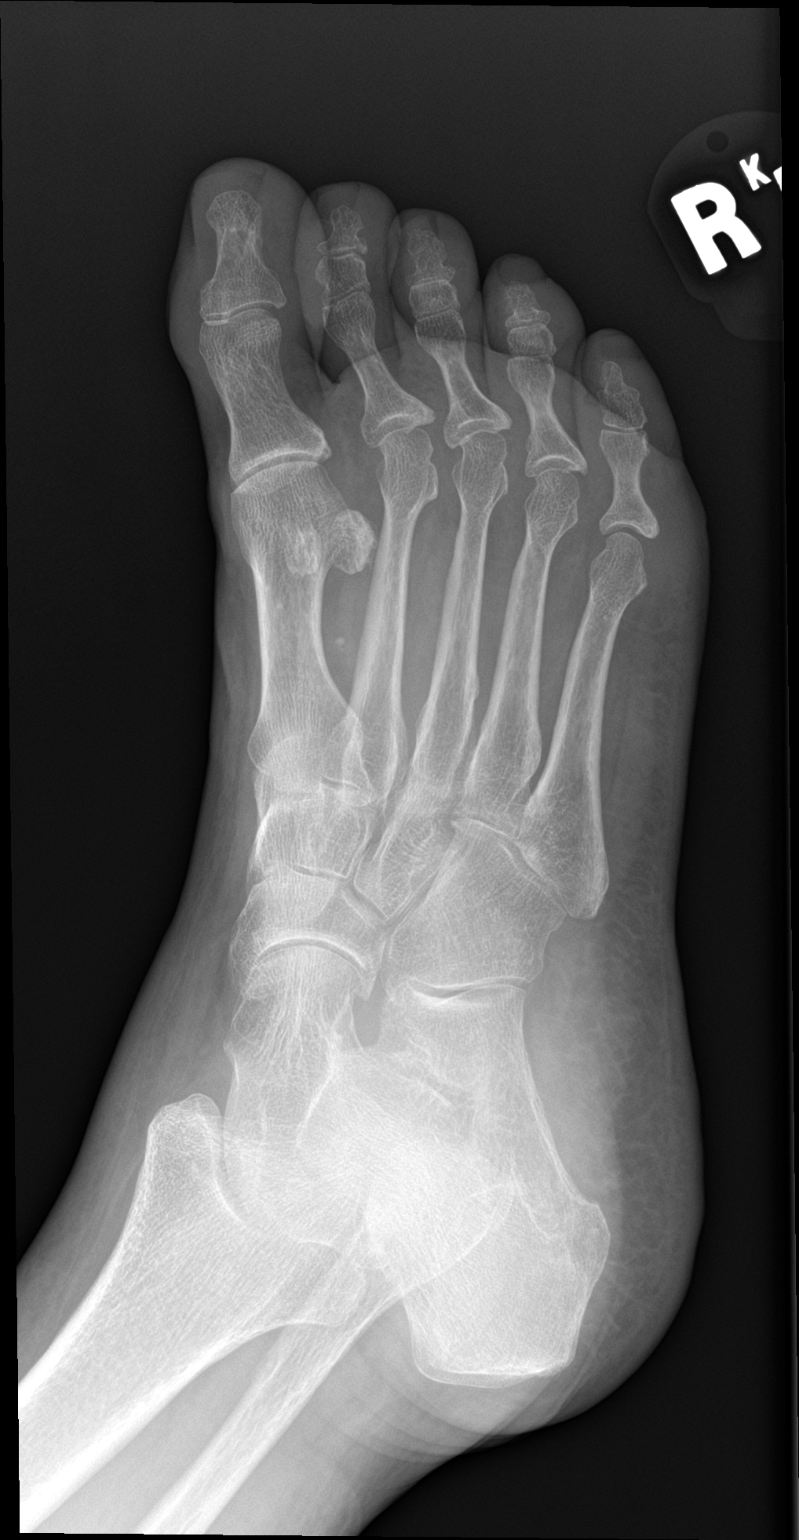

[foot lat]
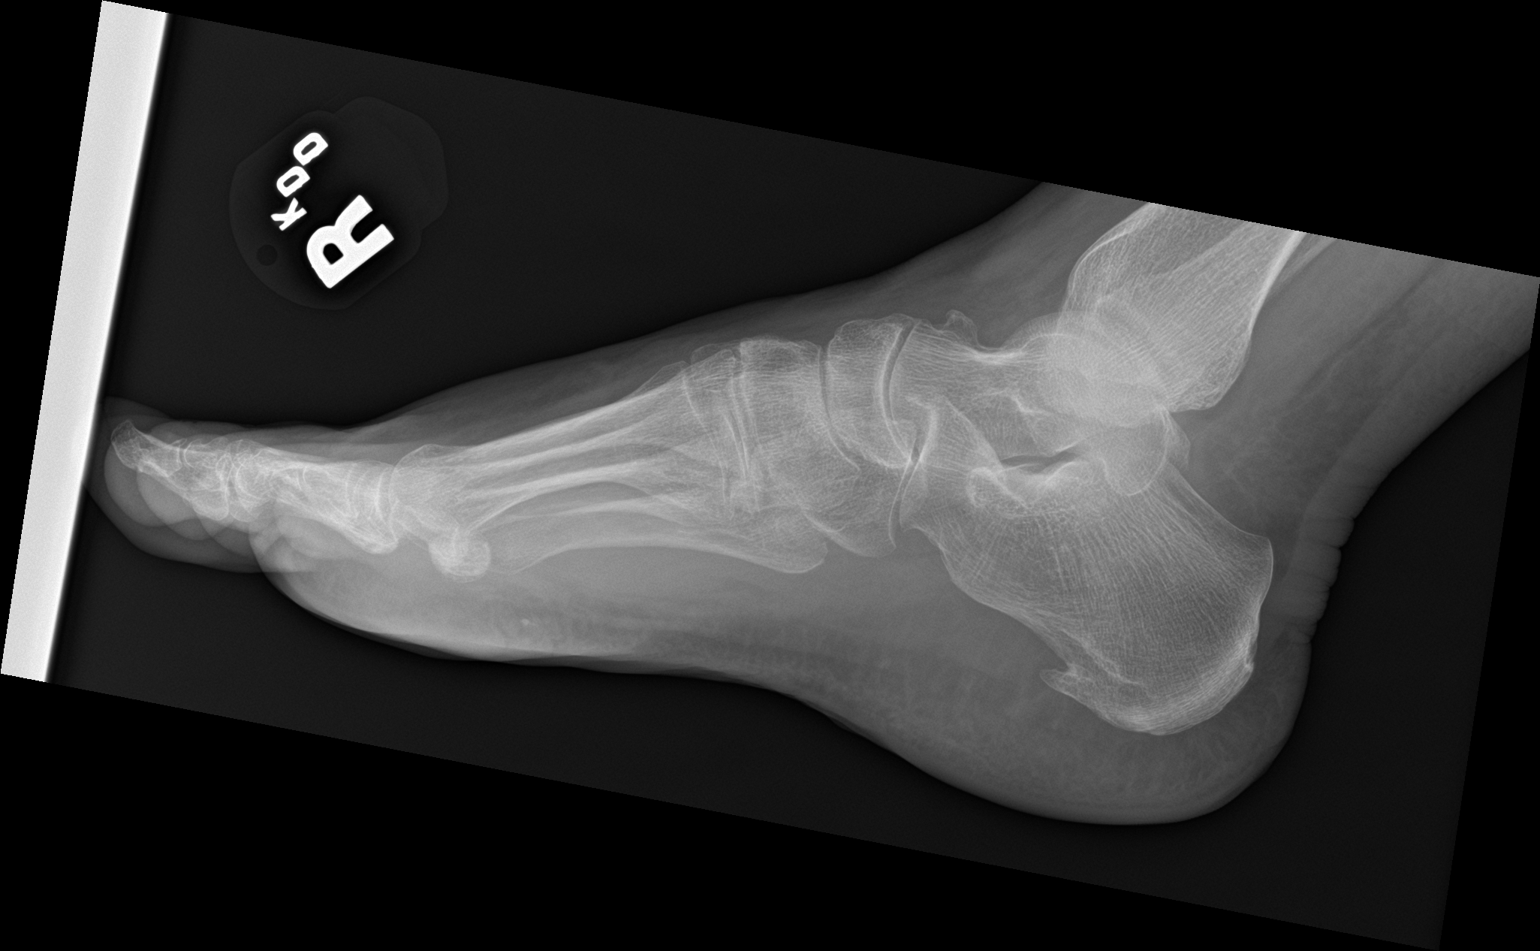

[3 of 3 positions shown; findings below may reference images not displayed]

FINDINGS: No acute displaced fracture or malalignment. Moderate plantar
calcaneal spur. Mild degenerative changes at the first MTP joint.
IMPRESSION: No acute osseous abnormality.

## 2019-04-12 NOTE — Telephone Encounter (Signed)
I called patient no answer no voicemail

## 2019-04-13 ENCOUNTER — Encounter: Payer: Self-pay | Admitting: Adult Health

## 2019-04-13 ENCOUNTER — Other Ambulatory Visit: Payer: Self-pay

## 2019-04-13 ENCOUNTER — Ambulatory Visit (INDEPENDENT_AMBULATORY_CARE_PROVIDER_SITE_OTHER): Payer: Medicare HMO | Admitting: Adult Health

## 2019-04-13 VITALS — BP 143/80 | HR 58 | Ht 61.0 in | Wt 162.0 lb

## 2019-04-13 DIAGNOSIS — L900 Lichen sclerosus et atrophicus: Secondary | ICD-10-CM

## 2019-04-13 NOTE — Progress Notes (Signed)
Patient ID: Holly Callahan, female   DOB: 11-Dec-1950, 68 y.o.   MRN: [id number] History of Present Illness: Holly Callahan is a 68 year old white female, married, PM, back for recheck for LSA, no itching or burning. PCP is Dr Gerarda Fraction.   Current Medications, Allergies, Past Medical History, Past Surgical History, Family History and Social History were reviewed in [facility] record.     Review of Systems: No burning or itching     Physical Exam:BP (!) 143/80 (BP Location: Left Arm, Patient Position: Sitting, Cuff Size: Normal)   Pulse (!) 58   Ht 5\' 1"  (1.549 m)   Wt 162 lb (73.5 kg)   BMI 30.61 kg/m  General:  Well developed, well nourished, no acute distress Skin:  Warm and dry Pelvic:  External genitalia is normal in appearance, has slight redness and thickness right labia, no areas at introitus  Psych:  No mood changes, alert and cooperative,seems happy Examination chaperoned by Estill Bamberg Rash LPN.   Impression: 1. Lichen sclerosus et atrophicus       Plan: Continue temovate 2 x weekly, has refills Follow up prn

## 2019-04-29 ENCOUNTER — Other Ambulatory Visit (HOSPITAL_COMMUNITY): Payer: Self-pay | Admitting: Internal Medicine

## 2019-04-29 DIAGNOSIS — Z1231 Encounter for screening mammogram for malignant neoplasm of breast: Secondary | ICD-10-CM

## 2019-05-12 ENCOUNTER — Other Ambulatory Visit: Payer: Self-pay

## 2019-05-12 ENCOUNTER — Ambulatory Visit (HOSPITAL_COMMUNITY)
Admission: RE | Admit: 2019-05-12 | Discharge: 2019-05-12 | Disposition: A | Discharge status: Home / Self Care | Payer: Medicare HMO | Source: Ambulatory Visit | Attending: Internal Medicine | Admitting: Internal Medicine

## 2019-05-12 DIAGNOSIS — Z1231 Encounter for screening mammogram for malignant neoplasm of breast: Secondary | ICD-10-CM | POA: Diagnosis not present

## 2019-07-08 ENCOUNTER — Other Ambulatory Visit: Payer: Self-pay | Admitting: *Deleted

## 2019-07-08 DIAGNOSIS — Z20828 Contact with and (suspected) exposure to other viral communicable diseases: Secondary | ICD-10-CM | POA: Diagnosis not present

## 2019-07-08 DIAGNOSIS — Z20822 Contact with and (suspected) exposure to covid-19: Secondary | ICD-10-CM

## 2019-07-09 LAB — NOVEL CORONAVIRUS, NAA: SARS-CoV-2, NAA: NOT DETECTED

## 2019-07-20 ENCOUNTER — Other Ambulatory Visit: Payer: Self-pay | Admitting: *Deleted

## 2019-07-20 DIAGNOSIS — Z20828 Contact with and (suspected) exposure to other viral communicable diseases: Secondary | ICD-10-CM | POA: Diagnosis not present

## 2019-07-20 DIAGNOSIS — Z20822 Contact with and (suspected) exposure to covid-19: Secondary | ICD-10-CM

## 2019-07-22 LAB — NOVEL CORONAVIRUS, NAA: SARS-CoV-2, NAA: NOT DETECTED

## 2019-07-27 DIAGNOSIS — Z20828 Contact with and (suspected) exposure to other viral communicable diseases: Secondary | ICD-10-CM | POA: Diagnosis not present

## 2019-07-30 ENCOUNTER — Other Ambulatory Visit: Payer: Self-pay

## 2019-07-30 DIAGNOSIS — Z20822 Contact with and (suspected) exposure to covid-19: Secondary | ICD-10-CM

## 2019-07-31 LAB — NOVEL CORONAVIRUS, NAA: SARS-CoV-2, NAA: DETECTED — AB

## 2019-08-30 DIAGNOSIS — R69 Illness, unspecified: Secondary | ICD-10-CM | POA: Diagnosis not present

## 2019-09-25 ENCOUNTER — Other Ambulatory Visit: Payer: Self-pay

## 2019-09-25 ENCOUNTER — Emergency Department (HOSPITAL_COMMUNITY): Payer: Medicare HMO | Admitting: Anesthesiology

## 2019-09-25 ENCOUNTER — Encounter (HOSPITAL_COMMUNITY): Payer: Self-pay | Admitting: Emergency Medicine

## 2019-09-25 ENCOUNTER — Encounter (HOSPITAL_COMMUNITY)
Admission: EM | Disposition: A | Discharge status: Home / Self Care | Payer: Self-pay | Source: Home / Self Care | Attending: Emergency Medicine

## 2019-09-25 ENCOUNTER — Emergency Department (HOSPITAL_COMMUNITY)
Admission: EM | Admit: 2019-09-25 | Discharge: 2019-09-25 | Disposition: A | Discharge status: Home / Self Care | Payer: Medicare HMO | Attending: Emergency Medicine | Admitting: Emergency Medicine

## 2019-09-25 ENCOUNTER — Emergency Department (HOSPITAL_COMMUNITY): Payer: Medicare HMO

## 2019-09-25 DIAGNOSIS — Z8249 Family history of ischemic heart disease and other diseases of the circulatory system: Secondary | ICD-10-CM | POA: Insufficient documentation

## 2019-09-25 DIAGNOSIS — Z79899 Other long term (current) drug therapy: Secondary | ICD-10-CM | POA: Diagnosis not present

## 2019-09-25 DIAGNOSIS — F419 Anxiety disorder, unspecified: Secondary | ICD-10-CM | POA: Diagnosis not present

## 2019-09-25 DIAGNOSIS — Z7902 Long term (current) use of antithrombotics/antiplatelets: Secondary | ICD-10-CM | POA: Diagnosis not present

## 2019-09-25 DIAGNOSIS — K219 Gastro-esophageal reflux disease without esophagitis: Secondary | ICD-10-CM | POA: Diagnosis not present

## 2019-09-25 DIAGNOSIS — E039 Hypothyroidism, unspecified: Secondary | ICD-10-CM | POA: Diagnosis not present

## 2019-09-25 DIAGNOSIS — K3580 Unspecified acute appendicitis: Secondary | ICD-10-CM | POA: Diagnosis not present

## 2019-09-25 DIAGNOSIS — K3589 Other acute appendicitis without perforation or gangrene: Secondary | ICD-10-CM | POA: Insufficient documentation

## 2019-09-25 DIAGNOSIS — R109 Unspecified abdominal pain: Secondary | ICD-10-CM | POA: Diagnosis not present

## 2019-09-25 DIAGNOSIS — Z823 Family history of stroke: Secondary | ICD-10-CM | POA: Diagnosis not present

## 2019-09-25 DIAGNOSIS — Z79890 Hormone replacement therapy (postmenopausal): Secondary | ICD-10-CM | POA: Diagnosis not present

## 2019-09-25 DIAGNOSIS — Z8673 Personal history of transient ischemic attack (TIA), and cerebral infarction without residual deficits: Secondary | ICD-10-CM | POA: Insufficient documentation

## 2019-09-25 DIAGNOSIS — I10 Essential (primary) hypertension: Secondary | ICD-10-CM | POA: Insufficient documentation

## 2019-09-25 DIAGNOSIS — R1031 Right lower quadrant pain: Secondary | ICD-10-CM | POA: Diagnosis present

## 2019-09-25 DIAGNOSIS — Z20828 Contact with and (suspected) exposure to other viral communicable diseases: Secondary | ICD-10-CM | POA: Diagnosis not present

## 2019-09-25 DIAGNOSIS — R69 Illness, unspecified: Secondary | ICD-10-CM | POA: Diagnosis not present

## 2019-09-25 HISTORY — PX: LAPAROSCOPIC APPENDECTOMY: SHX408

## 2019-09-25 LAB — URINALYSIS, ROUTINE W REFLEX MICROSCOPIC
Bacteria, UA: NONE SEEN
Bilirubin Urine: NEGATIVE
Glucose, UA: NEGATIVE mg/dL
Hgb urine dipstick: NEGATIVE
Ketones, ur: NEGATIVE mg/dL
Nitrite: NEGATIVE
Protein, ur: NEGATIVE mg/dL
Specific Gravity, Urine: 1.004 — ABNORMAL LOW (ref 1.005–1.030)
pH: 8 (ref 5.0–8.0)

## 2019-09-25 LAB — BASIC METABOLIC PANEL
Anion gap: 8 (ref 5–15)
BUN: 18 mg/dL (ref 8–23)
CO2: 27 mmol/L (ref 22–32)
Calcium: 9.2 mg/dL (ref 8.9–10.3)
Chloride: 101 mmol/L (ref 98–111)
Creatinine, Ser: 0.66 mg/dL (ref 0.44–1.00)
GFR calc Af Amer: >60 mL/min (ref >60)
GFR calc non Af Amer: >60 mL/min (ref >60)
Glucose, Bld: 133 mg/dL — ABNORMAL HIGH (ref 70–99)
Potassium: 3.5 mmol/L (ref 3.5–5.1)
Sodium: 136 mmol/L (ref 135–145)

## 2019-09-25 LAB — CBC WITH DIFFERENTIAL/PLATELET
Abs Immature Granulocytes: 0.08 10*3/uL — ABNORMAL HIGH (ref 0.00–0.07)
Basophils Absolute: 0 10*3/uL (ref 0.0–0.1)
Basophils Relative: 0 %
Eosinophils Absolute: 0.1 10*3/uL (ref 0.0–0.5)
Eosinophils Relative: 1 %
HCT: 38.8 % (ref 36.0–46.0)
Hemoglobin: 13.1 g/dL (ref 12.0–15.0)
Immature Granulocytes: 1 %
Lymphocytes Relative: 10 %
Lymphs Abs: 1.4 10*3/uL (ref 0.7–4.0)
MCH: 30.7 pg (ref 26.0–34.0)
MCHC: 33.8 g/dL (ref 30.0–36.0)
MCV: 90.9 fL (ref 80.0–100.0)
Monocytes Absolute: 0.8 10*3/uL (ref 0.1–1.0)
Monocytes Relative: 6 %
Neutro Abs: 10.6 10*3/uL — ABNORMAL HIGH (ref 1.7–7.7)
Neutrophils Relative %: 82 %
Platelets: 313 10*3/uL (ref 150–400)
RBC: 4.27 MIL/uL (ref 3.87–5.11)
RDW: 12.8 % (ref 11.5–15.5)
WBC: 13 10*3/uL — ABNORMAL HIGH (ref 4.0–10.5)
nRBC: 0 % (ref 0.0–0.2)

## 2019-09-25 LAB — RESPIRATORY PANEL BY RT PCR (FLU A&B, COVID)
Influenza A by PCR: NEGATIVE
Influenza B by PCR: NEGATIVE
SARS Coronavirus 2 by RT PCR: NEGATIVE

## 2019-09-25 LAB — TYPE AND SCREEN
ABO/RH(D): O POS
Antibody Screen: NEGATIVE

## 2019-09-25 SURGERY — APPENDECTOMY, LAPAROSCOPIC
Anesthesia: General

## 2019-09-25 MED ORDER — PROPOFOL 10 MG/ML IV BOLUS
INTRAVENOUS | Status: AC
  Filled 2019-09-25: qty 20

## 2019-09-25 MED ORDER — OXYCODONE HCL 5 MG PO TABS: 5.0000 mg | ORAL_TABLET | ORAL | 0 refills | Status: AC | PRN

## 2019-09-25 MED ORDER — 0.9 % SODIUM CHLORIDE (POUR BTL) OPTIME
TOPICAL | Status: DC | PRN
  Administered 2019-09-25: 1000 mL

## 2019-09-25 MED ORDER — MORPHINE SULFATE (PF) 2 MG/ML IV SOLN
2.0000 mg | Freq: Once | INTRAVENOUS | Status: AC
  Administered 2019-09-25: 05:00:00 2 mg via INTRAVENOUS
  Filled 2019-09-25: qty 1

## 2019-09-25 MED ORDER — CHLORHEXIDINE GLUCONATE CLOTH 2 % EX PADS: 6.0000 | MEDICATED_PAD | Freq: Once | CUTANEOUS | Status: DC

## 2019-09-25 MED ORDER — ONDANSETRON HCL 4 MG PO TABS: 4.0000 mg | ORAL_TABLET | Freq: Every day | ORAL | 1 refills | Status: AC | PRN

## 2019-09-25 MED ORDER — DOCUSATE SODIUM 100 MG PO CAPS: 100.0000 mg | ORAL_CAPSULE | Freq: Two times a day (BID) | ORAL | 2 refills | Status: AC

## 2019-09-25 MED ORDER — PROMETHAZINE HCL 25 MG/ML IJ SOLN: 6.2500 mg | INTRAMUSCULAR | Status: DC | PRN

## 2019-09-25 MED ORDER — SUCCINYLCHOLINE CHLORIDE 20 MG/ML IJ SOLN
INTRAMUSCULAR | Status: DC | PRN
  Administered 2019-09-25: 120 mg via INTRAVENOUS

## 2019-09-25 MED ORDER — ONDANSETRON HCL 4 MG/2ML IJ SOLN
4.0000 mg | Freq: Once | INTRAMUSCULAR | Status: AC
  Administered 2019-09-25: 05:00:00 4 mg via INTRAVENOUS
  Filled 2019-09-25: qty 2

## 2019-09-25 MED ORDER — HYDROMORPHONE HCL 1 MG/ML IJ SOLN
INTRAMUSCULAR | Status: AC
  Filled 2019-09-25: qty 0.5

## 2019-09-25 MED ORDER — SUCCINYLCHOLINE CHLORIDE 200 MG/10ML IV SOSY
PREFILLED_SYRINGE | INTRAVENOUS | Status: AC
  Filled 2019-09-25: qty 10

## 2019-09-25 MED ORDER — PROPOFOL 10 MG/ML IV BOLUS
INTRAVENOUS | Status: DC | PRN
  Administered 2019-09-25: 150 mg via INTRAVENOUS

## 2019-09-25 MED ORDER — SODIUM CHLORIDE 0.9 % IV SOLN: 2.0000 g | INTRAVENOUS | Status: DC

## 2019-09-25 MED ORDER — HYDROMORPHONE HCL 1 MG/ML IJ SOLN
0.2500 mg | INTRAMUSCULAR | Status: DC | PRN
  Administered 2019-09-25 (×2): 0.5 mg via INTRAVENOUS

## 2019-09-25 MED ORDER — EPHEDRINE 5 MG/ML INJ
INTRAVENOUS | Status: AC
  Filled 2019-09-25: qty 10

## 2019-09-25 MED ORDER — ONDANSETRON HCL 4 MG/2ML IJ SOLN
INTRAMUSCULAR | Status: DC | PRN
  Administered 2019-09-25: 4 mg via INTRAVENOUS

## 2019-09-25 MED ORDER — SODIUM CHLORIDE 0.9 % IV SOLN: Freq: Once | INTRAVENOUS | Status: AC

## 2019-09-25 MED ORDER — HYDROCODONE-ACETAMINOPHEN 7.5-325 MG PO TABS: 1.0000 | ORAL_TABLET | Freq: Once | ORAL | Status: DC | PRN

## 2019-09-25 MED ORDER — IOHEXOL 300 MG/ML  SOLN
100.0000 mL | Freq: Once | INTRAMUSCULAR | Status: AC | PRN
  Administered 2019-09-25: 06:00:00 100 mL via INTRAVENOUS

## 2019-09-25 MED ORDER — FENTANYL CITRATE (PF) 250 MCG/5ML IJ SOLN
INTRAMUSCULAR | Status: AC
  Filled 2019-09-25: qty 5

## 2019-09-25 MED ORDER — DEXAMETHASONE SODIUM PHOSPHATE 10 MG/ML IJ SOLN
INTRAMUSCULAR | Status: AC
  Filled 2019-09-25: qty 1

## 2019-09-25 MED ORDER — BUPIVACAINE HCL (PF) 0.25 % IJ SOLN
INTRAMUSCULAR | Status: AC
  Filled 2019-09-25: qty 30

## 2019-09-25 MED ORDER — DEXAMETHASONE SODIUM PHOSPHATE 10 MG/ML IJ SOLN
INTRAMUSCULAR | Status: DC | PRN
  Administered 2019-09-25: 4 mg via INTRAVENOUS

## 2019-09-25 MED ORDER — SUGAMMADEX SODIUM 500 MG/5ML IV SOLN
INTRAVENOUS | Status: DC | PRN
  Administered 2019-09-25: 200 mg via INTRAVENOUS

## 2019-09-25 MED ORDER — MIDAZOLAM HCL 2 MG/2ML IJ SOLN
INTRAMUSCULAR | Status: AC
  Filled 2019-09-25: qty 2

## 2019-09-25 MED ORDER — ROCURONIUM BROMIDE 100 MG/10ML IV SOLN
INTRAVENOUS | Status: DC | PRN
  Administered 2019-09-25: 40 mg via INTRAVENOUS

## 2019-09-25 MED ORDER — LACTATED RINGERS IV SOLN: INTRAVENOUS | Status: DC | PRN

## 2019-09-25 MED ORDER — MIDAZOLAM HCL 2 MG/2ML IJ SOLN: 0.5000 mg | Freq: Once | INTRAMUSCULAR | Status: DC | PRN

## 2019-09-25 MED ORDER — SODIUM CHLORIDE 0.9 % IV SOLN
INTRAVENOUS | Status: AC
  Filled 2019-09-25: qty 2

## 2019-09-25 MED ORDER — BUPIVACAINE HCL (PF) 0.5 % IJ SOLN
INTRAMUSCULAR | Status: DC | PRN
  Administered 2019-09-25: 20 mL

## 2019-09-25 MED ORDER — MIDAZOLAM HCL 2 MG/2ML IJ SOLN
INTRAMUSCULAR | Status: DC | PRN
  Administered 2019-09-25: .5 mg via INTRAVENOUS
  Administered 2019-09-25: 1 mg via INTRAVENOUS

## 2019-09-25 MED ORDER — LACTATED RINGERS IV SOLN: INTRAVENOUS | Status: DC

## 2019-09-25 MED ORDER — FENTANYL CITRATE (PF) 100 MCG/2ML IJ SOLN
INTRAMUSCULAR | Status: DC | PRN
  Administered 2019-09-25 (×2): 100 ug via INTRAVENOUS
  Administered 2019-09-25: 50 ug via INTRAVENOUS

## 2019-09-25 MED ORDER — PIPERACILLIN-TAZOBACTAM 3.375 G IVPB 30 MIN
3.3750 g | Freq: Once | INTRAVENOUS | Status: AC
  Administered 2019-09-25: 08:00:00 3.375 g via INTRAVENOUS
  Filled 2019-09-25: qty 50

## 2019-09-25 MED ORDER — ROCURONIUM BROMIDE 10 MG/ML (PF) SYRINGE
PREFILLED_SYRINGE | INTRAVENOUS | Status: AC
  Filled 2019-09-25: qty 10

## 2019-09-25 MED ORDER — SODIUM CHLORIDE 0.9 % IV SOLN
INTRAVENOUS | Status: DC | PRN
  Administered 2019-09-25: 2 g via INTRAVENOUS

## 2019-09-25 SURGICAL SUPPLY — 43 items
BAG RETRIEVAL 10 (BASKET) ×1
BLADE SURG 15 STRL LF DISP TIS (BLADE) ×1 IMPLANT
BLADE SURG 15 STRL SS (BLADE) ×1
CHLORAPREP W/TINT 26 (MISCELLANEOUS) ×2 IMPLANT
CLOTH BEACON ORANGE TIMEOUT ST (SAFETY) ×2 IMPLANT
COVER LIGHT HANDLE STERIS (MISCELLANEOUS) ×4 IMPLANT
CUTTER FLEX LINEAR 45M (STAPLE) ×2 IMPLANT
DECANTER SPIKE VIAL GLASS SM (MISCELLANEOUS) ×2 IMPLANT
DERMABOND ADVANCED (GAUZE/BANDAGES/DRESSINGS) ×1
DERMABOND ADVANCED .7 DNX12 (GAUZE/BANDAGES/DRESSINGS) ×1 IMPLANT
ELECT REM PT RETURN 9FT ADLT (ELECTROSURGICAL) ×2
ELECTRODE REM PT RTRN 9FT ADLT (ELECTROSURGICAL) ×1 IMPLANT
GLOVE BIO SURGEON STRL SZ 6.5 (GLOVE) ×2 IMPLANT
GLOVE BIOGEL M 6.5 STRL (GLOVE) ×4 IMPLANT
GLOVE BIOGEL PI IND STRL 6.5 (GLOVE) ×3 IMPLANT
GLOVE BIOGEL PI IND STRL 7.0 (GLOVE) ×4 IMPLANT
GLOVE BIOGEL PI INDICATOR 6.5 (GLOVE) ×3
GLOVE BIOGEL PI INDICATOR 7.0 (GLOVE) ×4
GOWN STRL REUS W/TWL LRG LVL3 (GOWN DISPOSABLE) ×4 IMPLANT
INST SET LAPROSCOPIC AP (KITS) ×2 IMPLANT
KIT TURNOVER KIT A (KITS) ×2 IMPLANT
MANIFOLD NEPTUNE II (INSTRUMENTS) ×2 IMPLANT
NEEDLE INSUFFLATION 14GA 120MM (NEEDLE) ×2 IMPLANT
NS IRRIG 1000ML POUR BTL (IV SOLUTION) ×2 IMPLANT
PACK LAP CHOLE LZT030E (CUSTOM PROCEDURE TRAY) ×2 IMPLANT
PAD ARMBOARD 7.5X6 YLW CONV (MISCELLANEOUS) ×2 IMPLANT
RELOAD 45 VASCULAR/THIN (ENDOMECHANICALS) IMPLANT
RELOAD STAPLE TA45 3.5 REG BLU (ENDOMECHANICALS) IMPLANT
SET BASIN LINEN APH (SET/KITS/TRAYS/PACK) ×2 IMPLANT
SET TUBE IRRIG SUCTION NO TIP (IRRIGATION / IRRIGATOR) IMPLANT
SET TUBE SMOKE EVAC HIGH FLOW (TUBING) ×2 IMPLANT
SHEARS HARMONIC ACE PLUS 36CM (ENDOMECHANICALS) ×2 IMPLANT
SUT MNCRL AB 4-0 PS2 18 (SUTURE) ×4 IMPLANT
SUT VICRYL 0 UR6 27IN ABS (SUTURE) ×2 IMPLANT
SYS BAG RETRIEVAL 10MM (BASKET) ×1
SYSTEM BAG RETRIEVAL 10MM (BASKET) ×1 IMPLANT
TRAY FOLEY W/BAG SLVR 16FR (SET/KITS/TRAYS/PACK) ×1
TRAY FOLEY W/BAG SLVR 16FR ST (SET/KITS/TRAYS/PACK) ×1 IMPLANT
TROCAR ENDO BLADELESS 11MM (ENDOMECHANICALS) ×2 IMPLANT
TROCAR ENDO BLADELESS 12MM (ENDOMECHANICALS) ×2 IMPLANT
TROCAR XCEL NON-BLD 5MMX100MML (ENDOMECHANICALS) ×2 IMPLANT
WARMER LAPAROSCOPE (MISCELLANEOUS) ×2 IMPLANT
YANKAUER SUCT 12FT TUBE ARGYLE (SUCTIONS) ×2 IMPLANT

## 2019-09-25 NOTE — Anesthesia Procedure Notes (Signed)
Procedure Name: Intubation Date/Time: 09/25/2019 10:42 AM Performed by: Lenice Llamas, MD Pre-anesthesia Checklist: Patient identified, Emergency Drugs available, Suction available, Patient being monitored and Timeout performed Patient Re-evaluated:Patient Re-evaluated prior to induction Oxygen Delivery Method: Circle system utilized Preoxygenation: Pre-oxygenation with 100% oxygen Induction Type: IV induction, Rapid sequence and Cricoid Pressure applied Laryngoscope Size: Mac and 4 Grade View: Grade III Tube type: Oral Tube size: 7.0 mm Number of attempts: 1 Airway Equipment and Method: Stylet Placement Confirmation: ETT inserted through vocal cords under direct vision,  positive ETCO2 and breath sounds checked- equal and bilateral Secured at: 22 cm Tube secured with: Tape Dental Injury: Teeth and Oropharynx as per pre-operative assessment  Difficulty Due To: Difficult Airway- due to anterior larynx Future Recommendations: Recommend- induction with short-acting agent, and alternative techniques readily available Comments: Went easily x1 with cricoid  Poor visualization, difficult view with [facility]

## 2019-09-25 NOTE — Anesthesia Preprocedure Evaluation (Addendum)
Anesthesia Evaluation  Patient identified by MRN, date of birth, ID band Patient awake    Reviewed: Allergy & Precautions, NPO status , Patient's Chart, lab work & pertinent test results  History of Anesthesia Complications (+) PONV and history of anesthetic complications  Airway Mallampati: II  TM Distance: >3 FB Neck ROM: Full    Dental no notable dental hx. (+) Teeth Intact   Pulmonary neg pulmonary ROS,    Pulmonary exam normal breath sounds clear to auscultation       Cardiovascular Exercise Tolerance: Good hypertension, Pt. on medications negative cardio ROS Normal cardiovascular examI Rhythm:Regular Rate:Normal     Neuro/Psych Anxiety TIACVA negative psych ROS   GI/Hepatic Neg liver ROS, GERD  Medicated and Controlled,  Endo/Other  Hypothyroidism   Renal/GU negative Renal ROS  negative genitourinary   Musculoskeletal  (+) Arthritis , Osteoarthritis,    Abdominal   Peds negative pediatric ROS (+)  Hematology negative hematology ROS (+)   Anesthesia Other Findings   Reproductive/Obstetrics negative OB ROS                            Anesthesia Physical Anesthesia Plan  ASA: II and emergent  Anesthesia Plan: General   Post-op Pain Management:    Induction: Intravenous  PONV Risk Score and Plan: 4 or greater and Midazolam, Dexamethasone, Ondansetron and Treatment may vary due to age or medical condition  Airway Management Planned: Oral ETT  Additional Equipment:   Intra-op Plan:   Post-operative Plan: Extubation in OR  Informed Consent: I have reviewed the patients History and Physical, chart, labs and discussed the procedure including the risks, benefits and alternatives for the proposed anesthesia with the patient or authorized representative who has indicated his/her understanding and acceptance.     Dental advisory given  Plan Discussed with: CRNA  Anesthesia  Plan Comments: (Plan Full PPE use Plan GETA D/W PT -WTP with same after Q&A)        Anesthesia Quick Evaluation

## 2019-09-25 NOTE — H&P (Signed)
[facility] History and Physical  Reason for Referral: Acute appendicitis  Referring Physician:  Dr. Betsey Holiday   Chief Complaint    Abdominal Pain      Holly Callahan is a 68 y.o. female.  HPI: Holly Callahan is 68 yo with acute onset of abdominal pain that started yesterday afternoon and started with sharp and constant pain in the umbilical area and then moved to the right side. The pain continued to get worse and worse, and she had some associated nausea.  She reported no vomiting or fevers, and had a BM that day.  She is otherwise relatively healthy with a history of GERD, anxiety, and had a TIA last year with some right foot numbness that resolved very quickly. She was placed on Plavix at that time. She says she has been taking the Plavix as prescribed. She has had no bad bleeding or bruising on the Plavix.  She had COVID in October and she had a sore throat at that time and coughing.    Past Medical History:  Diagnosis Date  . Anxiety   . Arthritis   . GERD (gastroesophageal reflux disease)   . History of TIA (transient ischemic attack) and stroke   . Hypertension   . Hypothyroidism   . Numbness    hands bilat secondary to carpal tunnel   . PONV (postoperative nausea and vomiting)     Past Surgical History:  Procedure Laterality Date  . CESAREAN SECTION  1988  . CHOLECYSTECTOMY    . COLONOSCOPY  10/28/2011   Procedure: COLONOSCOPY;  Surgeon: Daneil Dolin, MD;  Location: [facility];  Service: Endoscopy;  Laterality: N/A;  8:30 AM  . TOTAL KNEE ARTHROPLASTY Right 10/04/2015   Procedure: RIGHT TOTAL KNEE ARTHROPLASTY;  Surgeon: Latanya Maudlin, MD;  Location: [facility];  Service: Orthopedics;  Laterality: Right;    Family History  Problem Relation Age of Onset  . Stroke Father   . Stroke Mother   . Heart attack Brother   . Stroke Brother     Social History   Tobacco Use  . Smoking status: Never Smoker  . Smokeless tobacco: Never Used  Substance Use  Topics  . Alcohol use: No  . Drug use: No    Medications: I have reviewed the patient's current medications. No current facility-administered medications for this encounter.   Current Outpatient Medications  Medication Sig Dispense Refill Last Dose  . acetaminophen (TYLENOL) 500 MG tablet Take 1,000 mg by mouth as needed.     . ALPRAZolam (XANAX) 0.5 MG tablet Take 0.25-0.5 mg by mouth daily. Pt usually only takes only 1/2 tab at night     . amLODipine (NORVASC) 5 MG tablet Take 1 tablet (5 mg total) by mouth daily. 30 tablet 2   . amoxicillin (AMOXIL) 500 MG capsule Take 2,000 mg by mouth as directed. Take 4 capsules 1 hour prior to dental appointment     . atorvastatin (LIPITOR) 20 MG tablet Take 1 tablet (20 mg total) by mouth daily at 6 PM. 30 tablet 2   . Calcium Carb-Cholecalciferol (CALCIUM 600 + D PO) Take 1 tablet by mouth 2 (two) times daily.     . clobetasol ointment (TEMOVATE) 0.05 % Apply bid to affected area for 2 weeks, then 2-3 x weekly 45 g 3   . clopidogrel (PLAVIX) 75 MG tablet Take 1 tablet (75 mg total) by mouth daily. 30 tablet 2   . fish oil-omega-3 fatty acids 1000 MG capsule  Take 1 g by mouth daily.      . hydrochlorothiazide (HYDRODIURIL) 25 MG tablet Take 25 mg by mouth daily.       Marland Kitchen levothyroxine (SYNTHROID, LEVOTHROID) 25 MCG tablet Take 25 mcg by mouth daily.       Marland Kitchen omeprazole (PRILOSEC) 40 MG capsule Take 40 mg by mouth daily.     . sertraline (ZOLOFT) 25 MG tablet TAKE 1 TABLET BY MOUTH EVERY DAY (Patient not taking: Reported on 01/07/2019) 90 tablet 3   . valACYclovir (VALTREX) 1000 MG tablet Take 1 tablet (1,000 mg total) by mouth daily. 30 tablet 6    No Known Allergies  ROS:  A comprehensive review of systems was negative except for: Gastrointestinal: positive for abdominal pain and nausea  Blood pressure 137/69, pulse (!) 55, temperature 97.6 F (36.4 C), temperature source Oral, resp. rate 18, height 5\' 1"  (1.549 m), weight 68 kg, SpO2 97  %. Physical Exam Vitals reviewed.  Constitutional:      Appearance: She is well-developed.  HENT:     Head: Normocephalic and atraumatic.  Eyes:     Extraocular Movements: Extraocular movements intact.  Cardiovascular:     Rate and Rhythm: Normal rate and regular rhythm.  Pulmonary:     Effort: Pulmonary effort is normal.     Breath sounds: Normal breath sounds.  Abdominal:     General: There is no distension.     Palpations: Abdomen is soft.     Tenderness: There is abdominal tenderness in the right lower quadrant. There is guarding. There is no rebound.     Hernia: No hernia is present.  Musculoskeletal:     Comments: Moves all extremities   Skin:    General: Skin is warm and dry.  Neurological:     General: No focal deficit present.     Mental Status: She is alert and oriented to person, place, and time.  Psychiatric:        Mood and Affect: Mood normal.        Behavior: Behavior normal.     Results: Results for orders placed or performed during the hospital encounter of 09/25/19 (from the past 48 hour(s))  Urinalysis, Routine w reflex microscopic     Status: Abnormal   Collection Time: 09/25/19  4:36 AM  Result Value Ref Range   Color, Urine STRAW (A) YELLOW   APPearance CLEAR CLEAR   Specific Gravity, Urine 1.004 (L) 1.005 - 1.030   pH 8.0 5.0 - 8.0   Glucose, UA NEGATIVE NEGATIVE mg/dL   Hgb urine dipstick NEGATIVE NEGATIVE   Bilirubin Urine NEGATIVE NEGATIVE   Ketones, ur NEGATIVE NEGATIVE mg/dL   Protein, ur NEGATIVE NEGATIVE mg/dL   Nitrite NEGATIVE NEGATIVE   Leukocytes,Ua TRACE (A) NEGATIVE   WBC, UA 0-5 0 - 5 WBC/hpf   Bacteria, UA NONE SEEN NONE SEEN   Squamous Epithelial / LPF 0-5 0 - 5    Comment: Performed at [facility], [address], [place], [place] [address]  CBC with Differential     Status: Abnormal   Collection Time: 09/25/19  4:55 AM  Result Value Ref Range   WBC 13.0 (H) 4.0 - 10.5 K/uL   RBC 4.27 3.87 - 5.11 MIL/uL   Hemoglobin 13.1  12.0 - 15.0 g/dL   HCT 38.8 36.0 - 46.0 %   MCV 90.9 80.0 - 100.0 fL   MCH 30.7 26.0 - 34.0 pg   MCHC 33.8 30.0 - 36.0 g/dL   RDW  12.8 11.5 - 15.5 %   Platelets 313 150 - 400 K/uL   nRBC 0.0 0.0 - 0.2 %   Neutrophils Relative % 82 %   Neutro Abs 10.6 (H) 1.7 - 7.7 K/uL   Lymphocytes Relative 10 %   Lymphs Abs 1.4 0.7 - 4.0 K/uL   Monocytes Relative 6 %   Monocytes Absolute 0.8 0.1 - 1.0 K/uL   Eosinophils Relative 1 %   Eosinophils Absolute 0.1 0.0 - 0.5 K/uL   Basophils Relative 0 %   Basophils Absolute 0.0 0.0 - 0.1 K/uL   Immature Granulocytes 1 %   Abs Immature Granulocytes 0.08 (H) 0.00 - 0.07 K/uL    Comment: Performed at [facility], [address], [place], [place] [id number]  Basic metabolic panel     Status: Abnormal   Collection Time: 09/25/19  4:55 AM  Result Value Ref Range   Sodium 136 135 - 145 mmol/L   Potassium 3.5 3.5 - 5.1 mmol/L   Chloride 101 98 - 111 mmol/L   CO2 27 22 - 32 mmol/L   Glucose, Bld 133 (H) 70 - 99 mg/dL   BUN 18 8 - 23 mg/dL   Creatinine, Ser 0.66 0.44 - 1.00 mg/dL   Calcium 9.2 8.9 - 10.3 mg/dL   GFR calc non Af Amer >60 >60 mL/min   GFR calc Af Amer >60 >60 mL/min   Anion gap 8 5 - 15    Comment: Performed at [facility], [address], [place], [place] [address]  Respiratory Panel by RT PCR (Flu A&B, Covid) - Nasopharyngeal Swab     Status: None   Collection Time: 09/25/19  7:36 AM   Specimen: Nasopharyngeal Swab  Result Value Ref Range   SARS Coronavirus 2 by RT PCR NEGATIVE NEGATIVE    Comment: (NOTE) SARS-CoV-2 target nucleic acids are NOT DETECTED. The SARS-CoV-2 RNA is generally detectable in upper respiratoy specimens during the acute phase of infection. The lowest concentration of SARS-CoV-2 viral copies this assay can detect is 131 copies/mL. A negative result does not preclude SARS-Cov-2 infection and should not be used as the sole basis for treatment or other patient management decisions. A negative result may occur with   improper specimen collection/handling, submission of specimen other than nasopharyngeal swab, presence of viral mutation(s) within the areas targeted by this assay, and inadequate number of viral copies (<131 copies/mL). A negative result must be combined with clinical observations, patient history, and epidemiological information. The expected result is Negative. Fact Sheet for Patients:  PinkCheek.be Fact Sheet for Healthcare Providers:  GravelBags.it This test is not yet ap proved or cleared by the [place] FDA and  has been authorized for detection and/or diagnosis of SARS-CoV-2 by FDA under an Emergency Use Authorization (EUA). This EUA will remain  in effect (meaning this test can be used) for the duration of the COVID-19 declaration under Section 564(b)(1) of the Act, 21 U.S.C. section 360bbb-3(b)(1), unless the authorization is terminated or revoked sooner.    Influenza A by PCR NEGATIVE NEGATIVE   Influenza B by PCR NEGATIVE NEGATIVE    Comment: (NOTE) The Xpert Xpress SARS-CoV-2/FLU/RSV assay is intended as an aid in  the diagnosis of influenza from Nasopharyngeal swab specimens and  should not be used as a sole basis for treatment. Nasal washings and  aspirates are unacceptable for Xpert Xpress SARS-CoV-2/FLU/RSV  testing. Fact Sheet for Patients: PinkCheek.be Fact Sheet for Healthcare Providers: GravelBags.it This test is not yet approved or cleared  by the [place] and  has been authorized for detection and/or diagnosis of SARS-CoV-2 by  FDA under an Emergency Use Authorization (EUA). This EUA will remain  in effect (meaning this test can be used) for the duration of the  Covid-19 declaration under Section 564(b)(1) of the Act, 21  U.S.C. section 360bbb-3(b)(1), unless the authorization is  terminated or revoked. Performed at [facility], [address], [place], [place] [address]    Personal- reviewed personally large distended appendix, appendicolith, no signs of rupture, retrocecal  CT ABDOMEN PELVIS W CONTRAST  Result Date: 09/25/2019 CLINICAL DATA:  Right-sided abdominal pain. Right lower quadrant and right upper quadrant pain. EXAM: CT ABDOMEN AND PELVIS WITH CONTRAST TECHNIQUE: Multidetector CT imaging of the abdomen and pelvis was performed using the standard protocol following bolus administration of intravenous contrast. CONTRAST:  181mL OMNIPAQUE IOHEXOL 300 MG/ML  SOLN COMPARISON:  Remote CT 02/20/2004 FINDINGS: Lower chest: Scattered linear subsegmental atelectasis. 5 mm right lower lobe pulmonary nodule series 4 image 5, may be partially calcified, likely granuloma. Hepatobiliary: No focal liver lesion. Postcholecystectomy. There is mild intrahepatic biliary ductal dilatation. Common bile duct is normal in caliber. Pancreas: No ductal dilatation or inflammation. Spleen: Normal in size without focal abnormality. Adrenals/Urinary Tract: Normal adrenal glands. Kidney slightly malrotated directed anteriorly. No hydronephrosis. No perinephric edema. Homogeneous renal enhancement with symmetric excretion on delayed phase imaging. Urinary bladder is physiologically distended. No bladder wall thickening. Stomach/Bowel: Acute appendicitis as described below. Stomach is nondistended. There is no small bowel wall thickening or inflammatory change. No obstruction. Moderate stool in the colon. No colonic wall thickening or inflammation. Appendix: Location: Retrocecal Diameter: 10 mm Appendicolith: No. Mucosal hyper-enhancement: Yes Extraluminal gas: No Periappendiceal collection: No. Periappendiceal fat stranding and trace free fluid but no organized collection. Vascular/Lymphatic: Aortic atherosclerosis. No aneurysm. Patent portal vein. Small bilateral external iliac nodes are likely reactive. Reproductive: Atrophic uterus, normal for age.   No adnexal mass. Other: No free air.  No intra-abdominal abscess. Musculoskeletal: Scoliotic curvature of the spine with mild degenerative change. There are no acute or suspicious osseous abnormalities. IMPRESSION: 1. Uncomplicated acute appendicitis. 2. Right middle lobe 5 mm pulmonary nodule. Suspect this is partially calcified granuloma, however non-contrast chest CT can be considered in 12 months if patient is high-risk. Electronically Signed   By: Keith Rake M.D.   On: 09/25/2019 06:49    Assessment & Plan:  NICLE MILKOWSKI is a 68 y.o. female with acute appendicitis on CT without signs of rupture but does have an appendicolith. Discussed with patient the options of laparoscopic appendectomy and the option of IV antibiotics with admission. Discussed that she is on Plavix and this increases her risk of bleeding but we would do everything to mitigate bleeding. Discussed that in [place] appendicitis is handled with Iv antibiotics and our studies have shown in the [place] that this is a reasonable option but there is a failure rate of about 30% especially with appendicolith.   -Laparoscopic appendectomy possible open, discussed risk of bleeding, discussed need for blood which is rare and consented for blood products, discussed infection, injury to other organs or need for an open procedure.  -Likely will d/c home after the surgery  -COVID Flu panel negative ]  All questions were answered to the satisfaction of the patient.   Virl Cagey 09/25/2019, 9:43 AM

## 2019-09-25 NOTE — ED Triage Notes (Signed)
Pt states she has had R sided abdominal pain since 3:30-4:00pm yesterday. Pt tried some Tylenol for the pain but no relief was obtained. Pt also c/o nausea but no vomiting.

## 2019-09-25 NOTE — Anesthesia Postprocedure Evaluation (Signed)
Anesthesia Post Note  Patient: Holly Callahan  Procedure(s) Performed: APPENDECTOMY LAPAROSCOPIC (N/A )  Patient location during evaluation: PACU Anesthesia Type: General Level of consciousness: awake, awake and alert, oriented and sedated Pain management: pain level controlled Vital Signs Assessment: vitals unstable and post-procedure vital signs reviewed and stable Respiratory status: spontaneous breathing and respiratory function stable Cardiovascular status: blood pressure returned to baseline Postop Assessment: no headache Anesthetic complications: no     Last Vitals:  Vitals:   09/25/19 0930 09/25/19 1142  BP: 124/65 (!) 144/88  Pulse: (!) 51 81  Resp:  16  Temp:  37.1 C  SpO2: 95% 92%    Last Pain:  Vitals:   09/25/19 1142  TempSrc:   PainSc: 7                  Audi Conover S Keyshon Stein

## 2019-09-25 NOTE — ED Notes (Signed)
Patient back from CT.

## 2019-09-25 NOTE — Discharge Instructions (Signed)
Discharge Laparoscopic Surgery Instructions:  Common Complaints: Right shoulder pain is common after laparoscopic surgery. This is secondary to the gas used in the surgery being trapped under the diaphragm.  Walk to help your body absorb the gas. This will improve in a few days. Pain at the port sites are common, especially the larger port sites. This will improve with time.  Some nausea is common and poor appetite. The main goal is to stay hydrated the first few days after surgery.   Diet/ Activity: Diet as tolerated. You may not have an appetite, but it is important to stay hydrated. Drink 64 ounces of water a day. Your appetite will return with time.  Shower per your regular routine daily.  Do not take hot showers. Take warm showers that are less than 10 minutes. Rest and listen to your body, but do not remain in bed all day.  Walk everyday for at least 15-20 minutes. Deep cough and move around every 1-2 hours in the first few days after surgery.  Do not lift > 10 lbs, perform excessive bending, pushing, pulling, squatting for 1-2 weeks after surgery.  Do not pick at the dermabond glue on your incision sites.  This glue film will remain in place for 1-2 weeks and will start to peel off.  Do not place lotions or balms on your incision unless instructed to specifically by Dr. Constance Haw.   Medication: Take tylenol and ibuprofen scheduled for pain control, alternating every 4-6 hours. If you are unable to take NSAIDs like ibuprofen then take Tylenol scheduled.   Example:  Tylenol 1000mg  @ 6am, 12noon, 6pm, 62midnight (Do not exceed 4000mg  of tylenol a day). Ibuprofen 800mg  @ 9am, 3pm, 9pm, 3am (Do not exceed 3600mg  of ibuprofen a day).  Take Roxicodone for breakthrough pain every 4 hours.  Take Colace for constipation related to narcotic pain medication. If you do not have a bowel movement in 2 days, take Miralax over the counter.  Drink plenty of water to also prevent constipation.   Contact  Information: If you have questions or concerns, please call our office, [phone], Monday- Thursday 8AM-5PM and Friday 8AM-12Noon.  If it is after hours or on the weekend, please call [facility], [phone], and ask to speak to the surgeon on call for Dr. Constance Haw at [facility].   Pain Expectations and Narcotics: -After surgery you will have pain associated with your incisions and this is normal. The pain is muscular and nerve pain, and will get better with time. -You are encouraged and expected to take non narcotic medications like tylenol and ibuprofen (when able) to treat pain as multiple modalities can aid with pain treatment. -Narcotics are only used when pain is severe or there is breakthrough pain. -You are not expected to have a pain score of 0 after surgery, as we cannot prevent pain. A pain score of 3-4 that allows you to be functional, move, walk, and tolerate some activity is the goal. The pain will continue to improve over the days after surgery and is dependent on your surgery. -Due to [place] law, we are only able to give a certain amount of pain medication to treat post operative pain, and we only give additional narcotics on a patient by patient basis.  -For most laparoscopic surgery, studies have shown that the majority of patients only need 10-15 narcotic pills, and for open surgeries most patients only need 15-20.   -Having appropriate expectations of pain and knowledge of pain management with  non narcotics is important as we do not want anyone to become addicted to narcotic pain medication.  -Using ice packs in the first 48 hours and heating pads after 48 hours, wearing an abdominal binder (when recommended), and using over the counter medications are all ways to help with pain management.   -Simple acts like meditation and mindfulness practices after surgery can also help with pain control and research has proven the benefit of these practices.     Laparoscopic  Appendectomy, Adult, Care After This sheet gives you information about how to care for yourself after your procedure. Your health care provider may also give you more specific instructions. If you have problems or questions, contact your health care provider. What can I expect after the procedure? After the procedure, it is common to have:  Little energy for normal activities.  Mild pain in the area where the incisions were made.  Difficulty passing stool (constipation). This can be caused by: ? Pain medicine. ? A decrease in your activity. Follow these instructions at home: Medicines  Take over-the-counter and prescription medicines only as told by your health care provider.  Do not drive or use heavy machinery while taking prescription pain medicine.  Ask your health care provider if the medicine prescribed to you can cause constipation. You may need to take steps to prevent or treat constipation, such as: ? Drink enough fluid to keep your urine pale yellow. ? Take over-the-counter or prescription medicines. ? Eat foods that are high in fiber, such as beans, whole grains, and fresh fruits and vegetables. ? Limit foods that are high in fat and processed sugars, such as fried or sweet foods. Incision care   Follow instructions from your health care provider about how to take care of your incisions. Make sure you: ? Wash your hands with soap and water before and after you change your bandage (dressing). If soap and water are not available, use hand sanitizer. ? Change your dressing as told by your health care provider. ? Leave stitches (sutures), skin glue, or adhesive strips in place. These skin closures may need to stay in place for 2 weeks or longer. If adhesive strip edges start to loosen and curl up, you may trim the loose edges. Do not remove adhesive strips completely unless your health care provider tells you to do that. ? You may shower.   Check your incision areas every day  for signs of infection. Check for: ? Redness, swelling, or pain. ? Fluid or blood. ? Warmth. ? Pus or a bad smell. Bathing  Keep your incisions clean and dry. Clean them as often as told by your health care provider. To do this: 1. Gently wash the incisions with soap and water. 2. Rinse the incisions with water to remove all soap. 3. Pat the incisions dry with a clean towel. Do not rub the incisions.  Do not take baths, swim, or use a hot tub for 2 weeks, or until your health care provider approves. You may take showers after 48 hours. Activity   Do not drive for 24 hours if you were given a sedative during your procedure.  Rest after the procedure. Return to your normal activities as told by your health care provider. Ask your health care provider what activities are safe for you.  No heavy lifting > 10 lbs for 1-2 weeks after surgery General instructions  If you were sent home with a drain, follow instructions from your health care provider about how  to care for it.  Take deep breaths. This helps to prevent your lungs from developing an infection (pneumonia).  Keep all follow-up visits as told by your health care provider. This is important. Contact a health care provider if:  You have redness, swelling, or pain around an incision.  You have fluid or blood coming from an incision.  Your incision feels warm to the touch.  You have pus or a bad smell coming from an incision or dressing.  Your incision edges break open after your sutures have been removed.  You have increasing pain in your shoulders.  You feel dizzy or you faint.  You develop shortness of breath.  You keep feeling nauseous or you are vomiting.  You have diarrhea or you cannot control your bowel functions.  You lose your appetite.  You develop swelling or pain in your legs.  You develop a rash. Get help right away if you have:  A fever.  Difficulty breathing.  Sharp pains in your  chest. Summary  After a laparoscopic appendectomy, it is common to have little energy for normal activities, mild pain in the area of the incisions, and constipation.  Infection is the most common complication after this procedure. Follow your health care provider's instructions about caring for yourself after the procedure.  Rest after the procedure. Return to your normal activities as told by your health care provider.  Contact your health care provider if you notice signs of infection around your incisions or you develop shortness of breath. Get help right away if you have a fever, chest pain, or difficulty breathing. This information is not intended to replace advice given to you by your health care provider. Make sure you discuss any questions you have with your health care provider. Document Released: 09/23/2005 Document Revised: 03/26/2018 Document Reviewed: 03/26/2018 Elsevier Patient Education  2020 [employer].

## 2019-09-25 NOTE — Op Note (Signed)
[facility]  Date of Surgery: 09/25/2019  Admit Date: 09/25/2019   Performing Service: General  Surgeon(s) and Role:    * Virl Cagey, MD - Primary   Pre-operative Diagnosis: Acute Appendicitis  Post-operative Diagnosis: Acute Appendicitis  Procedure Performed: Laparoscopic Appendectomy   Surgeon: Lanell Matar. Constance Haw, MD   Assistant: No qualified resident was available.   Anesthesia: General   Findings:  The appendix was found to be inflamed. There were not signs of necrosis. There was not perforation. There was not abscess formation.   Estimated Blood Loss: Minimal   Specimens:  ID Type Source Tests Collected by Time Destination  1 : appendix Tissue PATH Appendix SURGICAL PATHOLOGY Virl Cagey, MD [id number]      Complications: None; patient tolerated the procedure well.   Disposition: PACU - hemodynamically stable.   Condition: stable   Indications: The patient presented with a 1 day history of right-sided abdominal pain. A CT revealed findings consistent with acute appendicitis and appendicolith.   Procedure Details  Prior to the procedure, the risks, benefits, complications, treatment options, and expected outcomes were discussed with the patient and/or family, including but not limited to the risk of bleeding, infection, finding of a normal appendix, and the need for conversion to an open procedure.  We discussed additional risk due to her taking plavix and the option of IV antibiotic treatment alone.  She opted to proceed with surgery given the possibility of failure of antibiotic treatment and her appendicolith. There was concurrence with the proposed plan and informed consent was obtained. The patient was taken to the operating room, identified as Holly Callahan and the procedure verified as Laproscopic Appendectomy.    The patient was placed in the supine position and general anesthesia was induced, along with placement of  orogastric tube, SCD's, and a Foley catheter. The abdomen was prepped and draped in a sterile fashion. The abdomen was entered with Veress technique at the infraumbilical incision with a towel clamp elevating the abdomen. Intraperitoneal placement was confirmed with saline drop, low entry pressures, and easy insufflation. A 11 mm optiview trocar was placed under direct visualization with a 0 degree scope. The 10 mm 0 degree scope was placed in the abdomen and no evidence of injury was identified. A 12 mm port was placed in the left lower quadrant of the abdomen after skin incision with trocar placement under direct vision. A careful evaluation of the entire abdomen was carried out. An additional 5 mm port was placed in the suprapubic area under direct vision.  The patient was placed in Trendelenburg and left lateral decubitus position. The small intestines were retracted in the cephalad and left lateral direction away from the pelvis and right lower quadrant. The patient was found to have a dilated and inflamed appendix. There was not evidence of perforation.   The appendix was carefully dissected.  The mesentery was edematous, and it was taken down marching along the appendix down to the base using the harmonic energy device.  The appendix was divided at its base using the standard endo-GIA stapler coming across part of the cecum. No appendiceal stump was left in place.  The appendix was placed within an Endocatch specimen bag. There was no evidence of bleeding, leakage, or complication after division of the appendix.   Any remaining blood was suctioned out from the abdomen, hemostasis was confirmed. Arista was placed over the divided mesentery and on the staple line due to her plavix use to  ensure hemostasis.  The endocatch bag was removed via the 12 mm port. There was an omental adhesion to the umbilicus, and I was fearful this could cause an internal hernia in the future, so the camera was placed through the  12 mm trocar and the harmonic was used to take this adhesion down.  Remaining arista was placed on this omentum and there was no continued bleeding. Then the abdomen was desufflated. The appendix was passed off the field as a specimen.  Trocars were removed.   The the 12 mm and 10 mm port sites were closed with a 0 Vicryl suture. The trocar site skin wounds were closed using subcuticular 4-0 Monocryl suture and dermabond. The patient was then awakened from general anesthesia, extubated, and taken to PACU for recovery.   Instrument, sponge, and needle counts were correct at the conclusion of the case.   Curlene Labrum, MD [facility] [address] [place], [place] [address] [phone] (office)

## 2019-09-25 NOTE — Transfer of Care (Signed)
Immediate Anesthesia Transfer of Care Note  Patient: Holly Callahan  Procedure(s) Performed: APPENDECTOMY LAPAROSCOPIC (N/A )  Patient Location: PACU  Anesthesia Type:General  Level of Consciousness: awake and alert   Airway & Oxygen Therapy: Patient Spontanous Breathing and Patient connected to nasal cannula oxygen  Post-op Assessment: Report given to RN and Post -op Vital signs reviewed and stable  Post vital signs: Reviewed and stable  Last Vitals:  Vitals Value Taken Time  BP    Temp    Pulse 83 09/25/19 1155  Resp 24 09/25/19 1155  SpO2 92 % 09/25/19 1155  Vitals shown include unvalidated device data.  Last Pain:  Vitals:   09/25/19 0800  TempSrc:   PainSc: 0-No pain         Complications: No apparent anesthesia complications

## 2019-09-25 NOTE — ED Provider Notes (Signed)
[facility] EMERGENCY DEPARTMENT Provider Note   CSN: [id number] Arrival date & time: 09/25/19  0356     History Chief Complaint  Patient presents with  . Abdominal Pain    Holly Callahan is a 68 y.o. female.  Patient presents to the ER for evaluation of constant right lower quadrant abdominal pain that started yesterday evening.  She has had nausea but no vomiting.  She denies diarrhea and constipation, did have a normal bowel movement yesterday.  She has not had any fever.  Patient reports that she could not sleep tonight because of the pain.        Past Medical History:  Diagnosis Date  . Anxiety   . Arthritis   . GERD (gastroesophageal reflux disease)   . History of TIA (transient ischemic attack) and stroke   . Hypertension   . Hypothyroidism   . Numbness    hands bilat secondary to carpal tunnel   . PONV (postoperative nausea and vomiting)     Patient Active Problem List   Diagnosis Date Noted  . Screening for colorectal cancer 12/09/2018  . History of UTI 12/09/2018  . Hematuria 12/09/2018  . Encounter for gynecological examination with Papanicolaou smear of cervix 12/09/2018  . Lichen sclerosus et atrophicus 12/09/2018  . History of herpes simplex infection 12/09/2018  . Low self esteem 12/09/2018  . Acute ischemic stroke ([place]) 11/10/2017  . TIA (transient ischemic attack) 11/09/2017  . Essential hypertension 11/09/2017  . Hyperlipidemia 11/09/2017  . Hypothyroidism 11/09/2017  . History of total knee arthroplasty 10/04/2015    Past Surgical History:  Procedure Laterality Date  . CESAREAN SECTION  1988  . CHOLECYSTECTOMY    . COLONOSCOPY  10/28/2011   Procedure: COLONOSCOPY;  Surgeon: Daneil Dolin, MD;  [facility];  Service: Endoscopy;  Laterality: N/A;  8:30 AM  . TOTAL KNEE ARTHROPLASTY Right 10/04/2015   Procedure: RIGHT TOTAL KNEE ARTHROPLASTY;  Surgeon: Latanya Maudlin, MD;  Location: [facility] ORS;  Service: Orthopedics;  Laterality:  Right;     OB History    Gravida  2   Para  2   Term  2   Preterm      AB      Living  2     SAB      TAB      Ectopic      Multiple      Live Births  2           Family History  Problem Relation Age of Onset  . Stroke Father   . Stroke Mother   . Heart attack Brother   . Stroke Brother     Social History   Tobacco Use  . Smoking status: Never Smoker  . Smokeless tobacco: Never Used  Substance Use Topics  . Alcohol use: No  . Drug use: No    Home Medications Prior to Admission medications   Medication Sig Start Date End Date Taking? Authorizing Provider  acetaminophen (TYLENOL) 500 MG tablet Take 1,000 mg by mouth as needed.    [provider]  ALPRAZolam Duanne Moron) 0.5 MG tablet Take 0.25-0.5 mg by mouth daily. Pt usually only takes only 1/2 tab at night    [provider]  amLODipine (NORVASC) 5 MG tablet Take 1 tablet (5 mg total) by mouth daily. 11/10/17   Orson Eva, MD  amoxicillin (AMOXIL) 500 MG capsule Take 2,000 mg by mouth as directed. Take 4 capsules 1 hour prior to  dental appointment    [provider]  atorvastatin (LIPITOR) 20 MG tablet Take 1 tablet (20 mg total) by mouth daily at 6 PM. 11/10/17   Tat, Shanon Brow, MD  Calcium Carb-Cholecalciferol (CALCIUM 600 + D PO) Take 1 tablet by mouth 2 (two) times daily.    [provider]  clobetasol ointment (TEMOVATE) 0.05 % Apply bid to affected area for 2 weeks, then 2-3 x weekly 12/09/18   Derrek Monaco A, NP  clopidogrel (PLAVIX) 75 MG tablet Take 1 tablet (75 mg total) by mouth daily. 11/11/17   Orson Eva, MD  fish oil-omega-3 fatty acids 1000 MG capsule Take 1 g by mouth daily.     [provider]  hydrochlorothiazide (HYDRODIURIL) 25 MG tablet Take 25 mg by mouth daily.      [provider]  levothyroxine (SYNTHROID, LEVOTHROID) 25 MCG tablet Take 25 mcg by mouth daily.      [provider]  omeprazole (PRILOSEC) 40 MG capsule Take 40 mg  by mouth daily.    [provider]  sertraline (ZOLOFT) 25 MG tablet TAKE 1 TABLET BY MOUTH EVERY DAY Patient not taking: Reported on 01/07/2019 12/31/18   Estill Dooms, NP  valACYclovir (VALTREX) 1000 MG tablet Take 1 tablet (1,000 mg total) by mouth daily. 12/09/18   Estill Dooms, NP    Allergies    Patient has no known allergies.  Review of Systems   Review of Systems  Gastrointestinal: Positive for abdominal pain and nausea.  All other systems reviewed and are negative.   Physical Exam Updated Vital Signs BP 138/86   Pulse 67   Temp 97.6 F (36.4 C) (Oral)   Resp 18   Ht 5\' 1"  (1.549 m)   Wt 68 kg   SpO2 93%   BMI 28.34 kg/m   Physical Exam Vitals and nursing note reviewed.  Constitutional:      General: She is not in acute distress.    Appearance: Normal appearance. She is well-developed.  HENT:     Head: Normocephalic and atraumatic.     Right Ear: Hearing normal.     Left Ear: Hearing normal.     Nose: Nose normal.  Eyes:     Conjunctiva/sclera: Conjunctivae normal.     Pupils: Pupils are equal, round, and reactive to light.  Cardiovascular:     Rate and Rhythm: Regular rhythm.     Heart sounds: S1 normal and S2 normal. No murmur. No friction rub. No gallop.   Pulmonary:     Effort: Pulmonary effort is normal. No respiratory distress.     Breath sounds: Normal breath sounds.  Chest:     Chest wall: No tenderness.  Abdominal:     General: Bowel sounds are normal.     Palpations: Abdomen is soft.     Tenderness: There is abdominal tenderness in the right lower quadrant. There is guarding. There is no rebound. Negative signs include Murphy's sign and McBurney's sign.     Hernia: No hernia is present.  Musculoskeletal:        General: Normal range of motion.     Cervical back: Normal range of motion and neck supple.  Skin:    General: Skin is warm and dry.     Findings: No rash.  Neurological:     Mental Status: She is alert and  oriented to person, place, and time.     GCS: GCS eye subscore is 4. GCS verbal subscore is 5. GCS  motor subscore is 6.     Cranial Nerves: No cranial nerve deficit.     Sensory: No sensory deficit.     Coordination: Coordination normal.  Psychiatric:        Speech: Speech normal.        Behavior: Behavior normal.        Thought Content: Thought content normal.     ED Results / Procedures / Treatments   Labs (all labs ordered are listed, but only abnormal results are displayed) Labs Reviewed  CBC WITH DIFFERENTIAL/PLATELET - Abnormal; Notable for the following components:      Result Value   WBC 13.0 (*)    Neutro Abs 10.6 (*)    Abs Immature Granulocytes 0.08 (*)    All other components within normal limits  BASIC METABOLIC PANEL - Abnormal; Notable for the following components:   Glucose, Bld 133 (*)    All other components within normal limits  URINALYSIS, ROUTINE W REFLEX MICROSCOPIC - Abnormal; Notable for the following components:   Color, Urine STRAW (*)    Specific Gravity, Urine 1.004 (*)    Leukocytes,Ua TRACE (*)    All other components within normal limits    EKG None  Radiology CT ABDOMEN PELVIS W CONTRAST  Result Date: 09/25/2019 CLINICAL DATA:  Right-sided abdominal pain. Right lower quadrant and right upper quadrant pain. EXAM: CT ABDOMEN AND PELVIS WITH CONTRAST TECHNIQUE: Multidetector CT imaging of the abdomen and pelvis was performed using the standard protocol following bolus administration of intravenous contrast. CONTRAST:  144mL OMNIPAQUE IOHEXOL 300 MG/ML  SOLN COMPARISON:  Remote CT 02/20/2004 FINDINGS: Lower chest: Scattered linear subsegmental atelectasis. 5 mm right lower lobe pulmonary nodule series 4 image 5, may be partially calcified, likely granuloma. Hepatobiliary: No focal liver lesion. Postcholecystectomy. There is mild intrahepatic biliary ductal dilatation. Common bile duct is normal in caliber. Pancreas: No ductal dilatation or  inflammation. Spleen: Normal in size without focal abnormality. Adrenals/Urinary Tract: Normal adrenal glands. Kidney slightly malrotated directed anteriorly. No hydronephrosis. No perinephric edema. Homogeneous renal enhancement with symmetric excretion on delayed phase imaging. Urinary bladder is physiologically distended. No bladder wall thickening. Stomach/Bowel: Acute appendicitis as described below. Stomach is nondistended. There is no small bowel wall thickening or inflammatory change. No obstruction. Moderate stool in the colon. No colonic wall thickening or inflammation. Appendix: Location: Retrocecal Diameter: 10 mm Appendicolith: No. Mucosal hyper-enhancement: Yes Extraluminal gas: No Periappendiceal collection: No. Periappendiceal fat stranding and trace free fluid but no organized collection. Vascular/Lymphatic: Aortic atherosclerosis. No aneurysm. Patent portal vein. Small bilateral external iliac nodes are likely reactive. Reproductive: Atrophic uterus, normal for age.  No adnexal mass. Other: No free air.  No intra-abdominal abscess. Musculoskeletal: Scoliotic curvature of the spine with mild degenerative change. There are no acute or suspicious osseous abnormalities. IMPRESSION: 1. Uncomplicated acute appendicitis. 2. Right middle lobe 5 mm pulmonary nodule. Suspect this is partially calcified granuloma, however non-contrast chest CT can be considered in 12 months if patient is high-risk. Electronically Signed   By: Keith Rake M.D.   On: 09/25/2019 06:49    Procedures Procedures (including critical care time)  Medications Ordered in ED Medications  piperacillin-tazobactam (ZOSYN) IVPB 3.375 g (has no administration in time range)  0.9 %  sodium chloride infusion ( Intravenous New Bag/Given 09/25/19 0455)  morphine 2 MG/ML injection 2 mg (2 mg Intravenous Given 09/25/19 0459)  ondansetron (ZOFRAN) injection 4 mg (4 mg Intravenous Given 09/25/19 0457)  iohexol (OMNIPAQUE) 300 MG/ML  solution 100  mL (100 mLs Intravenous Contrast Given 09/25/19 [id number])    ED Course  I have reviewed the triage vital signs and the nursing notes.  Pertinent labs & imaging results that were available during my care of the patient were reviewed by me and considered in my medical decision making (see chart for details).    MDM Rules/Calculators/A&P                     Patient presents to the emergency department for evaluation of right lower quadrant pain.  Examination reveals tenderness and guarding.  Patient with mild leukocytosis, vital signs are otherwise unremarkable.  CT scan shows acute uncomplicated appendicitis. Final Clinical Impression(s) / ED Diagnoses Final diagnoses:  Acute appendicitis, unspecified acute appendicitis type    Rx / DC Orders ED Discharge Orders    None       Orpah Greek, MD 09/25/19 [phone]

## 2019-09-25 NOTE — Progress Notes (Signed)
[facility]  Notified husband surgery is completed. Ok to continue the plavix. Rx sent to [facility]. Post op phone call.   Curlene Labrum, MD [facility] [address] [place], [place] [address] [phone] (office)

## 2019-09-25 NOTE — ED Notes (Signed)
Patient transported to CT 

## 2019-09-25 NOTE — ED Notes (Signed)
Patient ambulated to restroom to obtain a urine specimen at this time. Patient return to room. Specimen sent to Lab.

## 2019-09-28 LAB — SURGICAL PATHOLOGY

## 2019-10-12 ENCOUNTER — Telehealth (INDEPENDENT_AMBULATORY_CARE_PROVIDER_SITE_OTHER): Payer: Self-pay | Admitting: General Surgery

## 2019-10-12 ENCOUNTER — Other Ambulatory Visit: Payer: Self-pay

## 2019-10-12 DIAGNOSIS — K3580 Unspecified acute appendicitis: Secondary | ICD-10-CM

## 2019-10-12 NOTE — Progress Notes (Signed)
[facility]  I am calling the patient for post operative evaluation due to the current COVID 19 pandemic.  The patient had a laparoscopic appendectomy on 09/25/19. The patient reports that they are doing good. The are tolerating a diet, having good pain control, and having regular Bms.  The patient has no concerns.  Returned to work yesterday.  Continue with her normal schedule colonoscopy etc.  Will see the patient PRN.   Pathology: FINAL MICROSCOPIC DIAGNOSIS:   A. APPENDIX, APPENDECTOMY:  - Acute suppurative appendicitis.   Curlene Labrum, MD [facility] [address] [place], [place] [address] [phone] (office)

## 2020-01-21 ENCOUNTER — Other Ambulatory Visit (HOSPITAL_COMMUNITY): Payer: Self-pay | Admitting: Internal Medicine

## 2020-01-21 ENCOUNTER — Ambulatory Visit (HOSPITAL_COMMUNITY)
Admission: RE | Admit: 2020-01-21 | Discharge: 2020-01-21 | Disposition: A | Discharge status: Home / Self Care | Payer: Medicare HMO | Source: Ambulatory Visit | Attending: Internal Medicine | Admitting: Internal Medicine

## 2020-01-21 ENCOUNTER — Other Ambulatory Visit: Payer: Self-pay

## 2020-01-21 DIAGNOSIS — Z0001 Encounter for general adult medical examination with abnormal findings: Secondary | ICD-10-CM | POA: Diagnosis not present

## 2020-01-21 DIAGNOSIS — R7309 Other abnormal glucose: Secondary | ICD-10-CM | POA: Diagnosis not present

## 2020-01-21 DIAGNOSIS — Z1389 Encounter for screening for other disorder: Secondary | ICD-10-CM | POA: Diagnosis not present

## 2020-01-21 DIAGNOSIS — M79672 Pain in left foot: Secondary | ICD-10-CM

## 2020-01-21 DIAGNOSIS — E063 Autoimmune thyroiditis: Secondary | ICD-10-CM | POA: Diagnosis not present

## 2020-01-21 DIAGNOSIS — E039 Hypothyroidism, unspecified: Secondary | ICD-10-CM | POA: Diagnosis not present

## 2020-01-21 DIAGNOSIS — Z0000 Encounter for general adult medical examination without abnormal findings: Secondary | ICD-10-CM | POA: Diagnosis not present

## 2020-01-21 DIAGNOSIS — S9032XA Contusion of left foot, initial encounter: Secondary | ICD-10-CM | POA: Diagnosis not present

## 2020-01-21 DIAGNOSIS — Z6827 Body mass index (BMI) 27.0-27.9, adult: Secondary | ICD-10-CM | POA: Diagnosis not present

## 2020-01-21 DIAGNOSIS — E663 Overweight: Secondary | ICD-10-CM | POA: Diagnosis not present

## 2020-01-21 DIAGNOSIS — S90122A Contusion of left lesser toe(s) without damage to nail, initial encounter: Secondary | ICD-10-CM | POA: Diagnosis not present

## 2020-01-21 DIAGNOSIS — I10 Essential (primary) hypertension: Secondary | ICD-10-CM | POA: Diagnosis not present

## 2020-01-21 DIAGNOSIS — R69 Illness, unspecified: Secondary | ICD-10-CM | POA: Diagnosis not present

## 2020-01-21 DIAGNOSIS — G894 Chronic pain syndrome: Secondary | ICD-10-CM | POA: Diagnosis not present

## 2020-03-31 DIAGNOSIS — Z6827 Body mass index (BMI) 27.0-27.9, adult: Secondary | ICD-10-CM | POA: Diagnosis not present

## 2020-03-31 DIAGNOSIS — E063 Autoimmune thyroiditis: Secondary | ICD-10-CM | POA: Diagnosis not present

## 2020-03-31 DIAGNOSIS — L255 Unspecified contact dermatitis due to plants, except food: Secondary | ICD-10-CM | POA: Diagnosis not present

## 2020-03-31 DIAGNOSIS — I10 Essential (primary) hypertension: Secondary | ICD-10-CM | POA: Diagnosis not present

## 2020-03-31 DIAGNOSIS — M79672 Pain in left foot: Secondary | ICD-10-CM | POA: Diagnosis not present

## 2020-03-31 DIAGNOSIS — R7309 Other abnormal glucose: Secondary | ICD-10-CM | POA: Diagnosis not present

## 2020-04-26 DIAGNOSIS — K219 Gastro-esophageal reflux disease without esophagitis: Secondary | ICD-10-CM | POA: Diagnosis not present

## 2020-04-26 DIAGNOSIS — E039 Hypothyroidism, unspecified: Secondary | ICD-10-CM | POA: Diagnosis not present

## 2020-04-26 DIAGNOSIS — R69 Illness, unspecified: Secondary | ICD-10-CM | POA: Diagnosis not present

## 2020-04-26 DIAGNOSIS — Z6829 Body mass index (BMI) 29.0-29.9, adult: Secondary | ICD-10-CM | POA: Diagnosis not present

## 2020-04-26 DIAGNOSIS — I10 Essential (primary) hypertension: Secondary | ICD-10-CM | POA: Diagnosis not present

## 2020-04-26 DIAGNOSIS — F419 Anxiety disorder, unspecified: Secondary | ICD-10-CM | POA: Diagnosis not present

## 2020-04-26 DIAGNOSIS — G8929 Other chronic pain: Secondary | ICD-10-CM | POA: Diagnosis not present

## 2020-04-26 DIAGNOSIS — E785 Hyperlipidemia, unspecified: Secondary | ICD-10-CM | POA: Diagnosis not present

## 2020-04-26 DIAGNOSIS — E663 Overweight: Secondary | ICD-10-CM | POA: Diagnosis not present

## 2020-04-26 DIAGNOSIS — M1990 Unspecified osteoarthritis, unspecified site: Secondary | ICD-10-CM | POA: Diagnosis not present

## 2020-05-15 DIAGNOSIS — M19072 Primary osteoarthritis, left ankle and foot: Secondary | ICD-10-CM | POA: Diagnosis not present

## 2020-05-15 DIAGNOSIS — M6740 Ganglion, unspecified site: Secondary | ICD-10-CM | POA: Diagnosis not present

## 2020-05-15 DIAGNOSIS — M79672 Pain in left foot: Secondary | ICD-10-CM | POA: Diagnosis not present

## 2020-06-29 ENCOUNTER — Other Ambulatory Visit (HOSPITAL_COMMUNITY): Payer: Self-pay | Admitting: Internal Medicine

## 2020-06-29 DIAGNOSIS — Z1231 Encounter for screening mammogram for malignant neoplasm of breast: Secondary | ICD-10-CM

## 2020-07-06 ENCOUNTER — Ambulatory Visit (HOSPITAL_COMMUNITY)
Admission: RE | Admit: 2020-07-06 | Discharge: 2020-07-06 | Disposition: A | Discharge status: Home / Self Care | Payer: Medicare HMO | Source: Ambulatory Visit | Attending: Internal Medicine | Admitting: Internal Medicine

## 2020-07-06 ENCOUNTER — Other Ambulatory Visit: Payer: Self-pay

## 2020-07-06 DIAGNOSIS — Z1231 Encounter for screening mammogram for malignant neoplasm of breast: Secondary | ICD-10-CM | POA: Diagnosis not present

## 2020-07-20 ENCOUNTER — Other Ambulatory Visit: Payer: Self-pay | Admitting: Adult Health

## 2020-09-12 DIAGNOSIS — E663 Overweight: Secondary | ICD-10-CM | POA: Diagnosis not present

## 2020-09-12 DIAGNOSIS — R7309 Other abnormal glucose: Secondary | ICD-10-CM | POA: Diagnosis not present

## 2020-09-12 DIAGNOSIS — Z6829 Body mass index (BMI) 29.0-29.9, adult: Secondary | ICD-10-CM | POA: Diagnosis not present

## 2020-09-12 DIAGNOSIS — E039 Hypothyroidism, unspecified: Secondary | ICD-10-CM | POA: Diagnosis not present

## 2020-09-12 DIAGNOSIS — Z23 Encounter for immunization: Secondary | ICD-10-CM | POA: Diagnosis not present

## 2020-09-12 DIAGNOSIS — R609 Edema, unspecified: Secondary | ICD-10-CM | POA: Diagnosis not present

## 2020-10-24 DIAGNOSIS — U071 COVID-19: Secondary | ICD-10-CM | POA: Diagnosis not present

## 2021-01-05 DIAGNOSIS — E785 Hyperlipidemia, unspecified: Secondary | ICD-10-CM | POA: Diagnosis not present

## 2021-01-05 DIAGNOSIS — Z008 Encounter for other general examination: Secondary | ICD-10-CM | POA: Diagnosis not present

## 2021-01-05 DIAGNOSIS — K219 Gastro-esophageal reflux disease without esophagitis: Secondary | ICD-10-CM | POA: Diagnosis not present

## 2021-01-05 DIAGNOSIS — G8929 Other chronic pain: Secondary | ICD-10-CM | POA: Diagnosis not present

## 2021-01-05 DIAGNOSIS — Z6830 Body mass index (BMI) 30.0-30.9, adult: Secondary | ICD-10-CM | POA: Diagnosis not present

## 2021-01-05 DIAGNOSIS — E669 Obesity, unspecified: Secondary | ICD-10-CM | POA: Diagnosis not present

## 2021-01-05 DIAGNOSIS — I10 Essential (primary) hypertension: Secondary | ICD-10-CM | POA: Diagnosis not present

## 2021-01-05 DIAGNOSIS — M1990 Unspecified osteoarthritis, unspecified site: Secondary | ICD-10-CM | POA: Diagnosis not present

## 2021-01-05 DIAGNOSIS — Z7902 Long term (current) use of antithrombotics/antiplatelets: Secondary | ICD-10-CM | POA: Diagnosis not present

## 2021-01-05 DIAGNOSIS — E039 Hypothyroidism, unspecified: Secondary | ICD-10-CM | POA: Diagnosis not present

## 2021-01-05 DIAGNOSIS — R69 Illness, unspecified: Secondary | ICD-10-CM | POA: Diagnosis not present

## 2021-04-02 DIAGNOSIS — M13841 Other specified arthritis, right hand: Secondary | ICD-10-CM | POA: Diagnosis not present

## 2021-04-02 DIAGNOSIS — G5603 Carpal tunnel syndrome, bilateral upper limbs: Secondary | ICD-10-CM | POA: Diagnosis not present

## 2021-04-02 DIAGNOSIS — M79641 Pain in right hand: Secondary | ICD-10-CM | POA: Diagnosis not present

## 2021-04-02 DIAGNOSIS — M79642 Pain in left hand: Secondary | ICD-10-CM | POA: Diagnosis not present

## 2021-05-14 DIAGNOSIS — G5601 Carpal tunnel syndrome, right upper limb: Secondary | ICD-10-CM | POA: Diagnosis not present

## 2021-05-24 DIAGNOSIS — M1712 Unilateral primary osteoarthritis, left knee: Secondary | ICD-10-CM | POA: Diagnosis not present

## 2021-05-24 DIAGNOSIS — M25562 Pain in left knee: Secondary | ICD-10-CM | POA: Diagnosis not present

## 2021-05-24 DIAGNOSIS — M25561 Pain in right knee: Secondary | ICD-10-CM | POA: Diagnosis not present

## 2021-06-04 DIAGNOSIS — M79641 Pain in right hand: Secondary | ICD-10-CM | POA: Diagnosis not present

## 2021-06-13 DIAGNOSIS — R7309 Other abnormal glucose: Secondary | ICD-10-CM | POA: Diagnosis not present

## 2021-06-13 DIAGNOSIS — Z1331 Encounter for screening for depression: Secondary | ICD-10-CM | POA: Diagnosis not present

## 2021-06-13 DIAGNOSIS — F419 Anxiety disorder, unspecified: Secondary | ICD-10-CM | POA: Diagnosis not present

## 2021-06-13 DIAGNOSIS — R69 Illness, unspecified: Secondary | ICD-10-CM | POA: Diagnosis not present

## 2021-06-13 DIAGNOSIS — Z23 Encounter for immunization: Secondary | ICD-10-CM | POA: Diagnosis not present

## 2021-06-13 DIAGNOSIS — M1991 Primary osteoarthritis, unspecified site: Secondary | ICD-10-CM | POA: Diagnosis not present

## 2021-06-13 DIAGNOSIS — Z6827 Body mass index (BMI) 27.0-27.9, adult: Secondary | ICD-10-CM | POA: Diagnosis not present

## 2021-06-13 DIAGNOSIS — I10 Essential (primary) hypertension: Secondary | ICD-10-CM | POA: Diagnosis not present

## 2021-06-13 DIAGNOSIS — E063 Autoimmune thyroiditis: Secondary | ICD-10-CM | POA: Diagnosis not present

## 2021-06-13 DIAGNOSIS — Z0000 Encounter for general adult medical examination without abnormal findings: Secondary | ICD-10-CM | POA: Diagnosis not present

## 2021-08-01 ENCOUNTER — Other Ambulatory Visit (HOSPITAL_COMMUNITY): Payer: Self-pay | Admitting: Internal Medicine

## 2021-08-01 DIAGNOSIS — Z1231 Encounter for screening mammogram for malignant neoplasm of breast: Secondary | ICD-10-CM

## 2021-08-02 DIAGNOSIS — E663 Overweight: Secondary | ICD-10-CM | POA: Diagnosis not present

## 2021-08-02 DIAGNOSIS — Z6827 Body mass index (BMI) 27.0-27.9, adult: Secondary | ICD-10-CM | POA: Diagnosis not present

## 2021-08-02 DIAGNOSIS — Z1389 Encounter for screening for other disorder: Secondary | ICD-10-CM | POA: Diagnosis not present

## 2021-08-02 DIAGNOSIS — E038 Other specified hypothyroidism: Secondary | ICD-10-CM | POA: Diagnosis not present

## 2021-08-02 DIAGNOSIS — R7301 Impaired fasting glucose: Secondary | ICD-10-CM | POA: Diagnosis not present

## 2021-08-02 DIAGNOSIS — E559 Vitamin D deficiency, unspecified: Secondary | ICD-10-CM | POA: Diagnosis not present

## 2021-08-02 DIAGNOSIS — Z0000 Encounter for general adult medical examination without abnormal findings: Secondary | ICD-10-CM | POA: Diagnosis not present

## 2021-08-02 DIAGNOSIS — E538 Deficiency of other specified B group vitamins: Secondary | ICD-10-CM | POA: Diagnosis not present

## 2021-08-15 ENCOUNTER — Ambulatory Visit (HOSPITAL_COMMUNITY)
Admission: RE | Admit: 2021-08-15 | Discharge: 2021-08-15 | Disposition: A | Discharge status: Home / Self Care | Payer: Medicare HMO | Source: Ambulatory Visit | Attending: Internal Medicine | Admitting: Internal Medicine

## 2021-08-15 ENCOUNTER — Other Ambulatory Visit: Payer: Self-pay

## 2021-08-15 DIAGNOSIS — Z1231 Encounter for screening mammogram for malignant neoplasm of breast: Secondary | ICD-10-CM | POA: Insufficient documentation

## 2021-10-24 ENCOUNTER — Encounter: Payer: Self-pay | Admitting: *Deleted

## 2021-11-08 DIAGNOSIS — R7301 Impaired fasting glucose: Secondary | ICD-10-CM | POA: Diagnosis not present

## 2021-11-08 DIAGNOSIS — E782 Mixed hyperlipidemia: Secondary | ICD-10-CM | POA: Diagnosis not present

## 2021-11-08 DIAGNOSIS — E039 Hypothyroidism, unspecified: Secondary | ICD-10-CM | POA: Diagnosis not present

## 2021-11-19 DIAGNOSIS — G5602 Carpal tunnel syndrome, left upper limb: Secondary | ICD-10-CM | POA: Diagnosis not present

## 2021-12-03 DIAGNOSIS — M79642 Pain in left hand: Secondary | ICD-10-CM | POA: Diagnosis not present

## 2022-02-06 DIAGNOSIS — M79672 Pain in left foot: Secondary | ICD-10-CM | POA: Diagnosis not present

## 2022-02-06 DIAGNOSIS — R69 Illness, unspecified: Secondary | ICD-10-CM | POA: Diagnosis not present

## 2022-02-06 DIAGNOSIS — Z6827 Body mass index (BMI) 27.0-27.9, adult: Secondary | ICD-10-CM | POA: Diagnosis not present

## 2022-02-06 DIAGNOSIS — H6121 Impacted cerumen, right ear: Secondary | ICD-10-CM | POA: Diagnosis not present

## 2022-02-06 DIAGNOSIS — I10 Essential (primary) hypertension: Secondary | ICD-10-CM | POA: Diagnosis not present

## 2022-02-06 DIAGNOSIS — R7301 Impaired fasting glucose: Secondary | ICD-10-CM | POA: Diagnosis not present

## 2022-02-06 DIAGNOSIS — E039 Hypothyroidism, unspecified: Secondary | ICD-10-CM | POA: Diagnosis not present

## 2022-02-06 DIAGNOSIS — R7309 Other abnormal glucose: Secondary | ICD-10-CM | POA: Diagnosis not present

## 2022-02-06 DIAGNOSIS — M779 Enthesopathy, unspecified: Secondary | ICD-10-CM | POA: Diagnosis not present

## 2022-02-06 DIAGNOSIS — E663 Overweight: Secondary | ICD-10-CM | POA: Diagnosis not present

## 2022-02-06 DIAGNOSIS — M1991 Primary osteoarthritis, unspecified site: Secondary | ICD-10-CM | POA: Diagnosis not present

## 2022-04-20 ENCOUNTER — Ambulatory Visit
Admission: EM | Admit: 2022-04-20 | Discharge: 2022-04-20 | Disposition: A | Discharge status: Home / Self Care | Payer: Medicare HMO | Attending: Physician Assistant | Admitting: Physician Assistant

## 2022-04-20 DIAGNOSIS — J069 Acute upper respiratory infection, unspecified: Secondary | ICD-10-CM | POA: Diagnosis not present

## 2022-04-20 DIAGNOSIS — I10 Essential (primary) hypertension: Secondary | ICD-10-CM

## 2022-04-20 MED ORDER — BENZONATATE 100 MG PO CAPS: 100.0000 mg | ORAL_CAPSULE | Freq: Three times a day (TID) | ORAL | 0 refills | Status: DC

## 2022-04-20 MED ORDER — FLUTICASONE PROPIONATE 50 MCG/ACT NA SUSP: 1.0000 | Freq: Every day | NASAL | 0 refills | Status: DC

## 2022-04-20 NOTE — ED Triage Notes (Signed)
Pt reports cough congestion and sore throat x 3 days. Tylenol and Mucinex DM gives some relief.

## 2022-04-20 NOTE — Discharge Instructions (Signed)
I believe that you have a virus.  We are testing you for COVID and flu.  We will contact you if these are abnormal.  Continue your Mucinex DM.  Use Tessalon and Flonase for additional symptom relief.  Make sure you rest and drink plenty fluid.  If your symptoms are not improving or if anything worsens you need to be seen immediately.  Your blood pressure is elevated.  Please monitor this at home.  If you develop any chest pain, shortness of breath, headache, vision change, dizziness in setting of high blood pressure you need to go to the emergency room.  Follow-up with your PCP next week for recheck.

## 2022-04-20 NOTE — ED Provider Notes (Signed)
[facility]    CSN: [id number] Arrival date & time: 04/20/22  9798      History   Chief Complaint Chief Complaint  Patient presents with   Cough   Sore Throat         HPI Holly Callahan is a 71 y.o. female.   Patient presents today with a 3-day history of URI symptoms.  Reports cough, congestion, headache, fatigue/malaise.  Denies any fever, chest pain, shortness of breath, nausea, vomiting.  Reports multiple household sick contacts with similar symptoms.  She has had COVID in the past with last episode several years ago.  She denies history of smoking, asthma, COPD.  She has been taking Mucinex DM with improvement but not resolution of symptoms.  Denies any recent antibiotic or steroid use.  Blood pressure is elevated today.  Patient reports that she had taken her blood pressure medication right before coming to our clinic.  She denies any headache, chest pain, shortness of breath, dizziness, vision change.  She is able to monitor her blood pressure at home.  She does not take any decongestants as she had all medication she is taking over-the-counter approved by her pharmacist.    Past Medical History:  Diagnosis Date   Anxiety    Arthritis    GERD (gastroesophageal reflux disease)    History of TIA (transient ischemic attack) and stroke    Hypertension    Hypothyroidism    Numbness    hands bilat secondary to carpal tunnel    PONV (postoperative nausea and vomiting)     Patient Active Problem List   Diagnosis Date Noted   Acute appendicitis    Screening for colorectal cancer 12/09/2018   History of UTI 12/09/2018   Hematuria 12/09/2018   Encounter for gynecological examination with Papanicolaou smear of cervix 92/08/9416   Lichen sclerosus et atrophicus 12/09/2018   History of herpes simplex infection 12/09/2018   Low self esteem 12/09/2018   Acute ischemic stroke ([place]) 11/10/2017   TIA (transient ischemic attack) 11/09/2017   Essential hypertension  11/09/2017   Hyperlipidemia 11/09/2017   Hypothyroidism 11/09/2017   History of total knee arthroplasty 10/04/2015    Past Surgical History:  Procedure Laterality Date   CESAREAN SECTION  1988   CHOLECYSTECTOMY     COLONOSCOPY  10/28/2011   Procedure: COLONOSCOPY;  Surgeon: Daneil Dolin, MD;  [facility];  Service: Endoscopy;  Laterality: N/A;  8:30 AM   LAPAROSCOPIC APPENDECTOMY N/A 09/25/2019   Procedure: APPENDECTOMY LAPAROSCOPIC;  Surgeon: Virl Cagey, MD;  Location: [facility] ORS;  Service: General;  Laterality: N/A;   TOTAL KNEE ARTHROPLASTY Right 10/04/2015   Procedure: RIGHT TOTAL KNEE ARTHROPLASTY;  Surgeon: Latanya Maudlin, MD;  Location: [facility];  Service: Orthopedics;  Laterality: Right;    OB History     Gravida  2   Para  2   Term  2   Preterm      AB      Living  2      SAB      IAB      Ectopic      Multiple      Live Births  2            Home Medications    Prior to Admission medications   Medication Sig Start Date End Date Taking? Authorizing Provider  benzonatate (TESSALON) 100 MG capsule Take 1 capsule (100 mg total) by mouth every 8 (eight) hours. 04/20/22  Yes Samie Reasons K, PA-C  fluticasone (FLONASE) 50 MCG/ACT nasal spray Place 1 spray into both nostrils daily. 04/20/22  Yes Salvator Seppala K, PA-C  acetaminophen (TYLENOL) 500 MG tablet Take 1,000 mg by mouth as needed.    [provider]  ALPRAZolam Duanne Moron) 0.5 MG tablet Take 0.25-0.5 mg by mouth daily. Pt usually only takes only 1/2 tab at night    [provider]  amLODipine (NORVASC) 5 MG tablet Take 1 tablet (5 mg total) by mouth daily. 11/10/17   Orson Eva, MD  amoxicillin (AMOXIL) 500 MG capsule Take 2,000 mg by mouth as directed. Take 4 capsules 1 hour prior to dental appointment    [provider]  atorvastatin (LIPITOR) 20 MG tablet Take 1 tablet (20 mg total) by mouth daily at 6 PM. 11/10/17   Tat, Shanon Brow, MD  Calcium Carb-Cholecalciferol  (CALCIUM 600 + D PO) Take 1 tablet by mouth 2 (two) times daily.    [provider]  clobetasol ointment (TEMOVATE) 0.05 % APPLY OINTMENT TOPICALLY TWICE DAILY TO AFFECTED AREA(S) FOR 2 WEEKS, THEN 2-3 TIMES WEEKLY 07/20/20   Estill Dooms, NP  clopidogrel (PLAVIX) 75 MG tablet Take 1 tablet (75 mg total) by mouth daily. 11/11/17   Orson Eva, MD  fish oil-omega-3 fatty acids 1000 MG capsule Take 1 g by mouth daily.     [provider]  hydrochlorothiazide (HYDRODIURIL) 25 MG tablet Take 25 mg by mouth daily.      [provider]  levothyroxine (SYNTHROID, LEVOTHROID) 25 MCG tablet Take 25 mcg by mouth daily.      [provider]  omeprazole (PRILOSEC) 40 MG capsule Take 40 mg by mouth daily.    [provider]  sertraline (ZOLOFT) 25 MG tablet TAKE 1 TABLET BY MOUTH EVERY DAY Patient not taking: Reported on 01/07/2019 12/31/18   Estill Dooms, NP  valACYclovir (VALTREX) 1000 MG tablet Take 1 tablet (1,000 mg total) by mouth daily. 12/09/18   Estill Dooms, NP    Family History Family History  Problem Relation Age of Onset   Stroke Father    Stroke Mother    Heart attack Brother    Stroke Brother     Social History Social History   Tobacco Use   Smoking status: Never   Smokeless tobacco: Never  Vaping Use   Vaping Use: Never used  Substance Use Topics   Alcohol use: No   Drug use: No     Allergies   Patient has no known allergies.   Review of Systems Review of Systems  Constitutional:  Positive for activity change and fatigue. Negative for appetite change and fever.  HENT:  Positive for congestion, sinus pressure and sore throat. Negative for sneezing.   Eyes:  Negative for visual disturbance.  Respiratory:  Positive for cough. Negative for shortness of breath.   Cardiovascular:  Negative for chest pain.  Gastrointestinal:  Negative for abdominal pain, diarrhea, nausea and vomiting.  Neurological:  Positive for  headaches. Negative for dizziness and light-headedness.     Physical Exam Triage Vital Signs ED Triage Vitals  Enc Vitals Group     BP 04/20/22 0817 (!) 162/95     Pulse Rate 04/20/22 0817 75     Resp 04/20/22 0817 18     Temp 04/20/22 0817 99.1 F (37.3 C)     Temp Source 04/20/22 0817 Oral     SpO2 04/20/22 0817 94 %     Weight --  Height --      Head Circumference --      Peak Flow --      Pain Score 04/20/22 0816 7     Pain Loc --      Pain Edu? --      Excl. in [place]? --    No data found.  Updated Vital Signs BP 121/79   Pulse 71   Temp 99.1 F (37.3 C)   Resp (!) 24   SpO2 92%   Visual Acuity Right Eye Distance:   Left Eye Distance:   Bilateral Distance:    Right Eye Near:   Left Eye Near:    Bilateral Near:     Physical Exam Vitals reviewed.  Constitutional:      General: She is awake. She is not in acute distress.    Appearance: Normal appearance. She is well-developed. She is not ill-appearing.     Comments: Very pleasant female appears stated age in no acute distress sitting comfortably in exam room  HENT:     Head: Normocephalic and atraumatic.     Right Ear: Tympanic membrane, ear canal and external ear normal. Tympanic membrane is not erythematous or bulging.     Left Ear: Tympanic membrane, ear canal and external ear normal. Tympanic membrane is not erythematous or bulging.     Nose:     Right Sinus: Maxillary sinus tenderness present. No frontal sinus tenderness.     Left Sinus: Maxillary sinus tenderness present. No frontal sinus tenderness.     Mouth/Throat:     Pharynx: Uvula midline. No oropharyngeal exudate or posterior oropharyngeal erythema.  Cardiovascular:     Rate and Rhythm: Normal rate and regular rhythm.     Heart sounds: Normal heart sounds, S1 normal and S2 normal. No murmur heard. Pulmonary:     Effort: Pulmonary effort is normal.     Breath sounds: Normal breath sounds. No wheezing, rhonchi or rales.     Comments: Clear  to auscultation bilaterally Psychiatric:        Behavior: Behavior is cooperative.      UC Treatments / Results  Labs (all labs ordered are listed, but only abnormal results are displayed) Labs Reviewed  COVID-19, FLU A+B NAA    EKG   Radiology No results found.  Procedures Procedures (including critical care time)  Medications Ordered in UC Medications - No data to display  Initial Impression / Assessment and Plan / UC Course  I have reviewed the triage vital signs and the nursing notes.  Pertinent labs & imaging results that were available during my care of the patient were reviewed by me and considered in my medical decision making (see chart for details).     Patient is well-appearing, afebrile, nontoxic, nontachycardic.  Concern for viral etiology given clinical presentation.  Flu and COVID testing were obtained today-results pending.  Recommended that she continue over-the-counter medication including Mucinex DM.  She was prescribed Flonase and Tessalon to manage symptoms.  She is to rest and drink plenty fluid.  Discussed that if her symptoms are not improving by next week she needs to return for reevaluation.  If she has any worsening symptoms including chest pain, shortness of breath, fever, worsening cough, nausea/vomiting she needs to go to the emergency room.  Blood pressure is elevated today but improved at second reading.  Patient reports she had just taken her medication.  She monitors this closely at home and reports that it is typically normal.  She was  encouraged to avoid NSAIDs, decongestants, caffeine, sodium.  Recommended she continue monitoring her blood pressure and if persistently elevated she needs to be seen again.  She is to follow-up with her PCP for recheck next week.  Discussed that if she has any chest pain, shortness of breath, headache, vision changes, dizziness in the setting of high blood pressure she is to go to the emergency room to which she  expressed understanding.  Final Clinical Impressions(s) / UC Diagnoses   Final diagnoses:  Viral URI with cough  Elevated blood pressure reading with diagnosis of hypertension     Discharge Instructions      I believe that you have a virus.  We are testing you for COVID and flu.  We will contact you if these are abnormal.  Continue your Mucinex DM.  Use Tessalon and Flonase for additional symptom relief.  Make sure you rest and drink plenty fluid.  If your symptoms are not improving or if anything worsens you need to be seen immediately.  Your blood pressure is elevated.  Please monitor this at home.  If you develop any chest pain, shortness of breath, headache, vision change, dizziness in setting of high blood pressure you need to go to the emergency room.  Follow-up with your PCP next week for recheck.     ED Prescriptions     Medication Sig Dispense Auth. Provider   fluticasone (FLONASE) 50 MCG/ACT nasal spray Place 1 spray into both nostrils daily. 16 g Malin Cervini K, PA-C   benzonatate (TESSALON) 100 MG capsule Take 1 capsule (100 mg total) by mouth every 8 (eight) hours. 21 capsule Kalil Woessner K, PA-C      PDMP not reviewed this encounter.   Terrilee Croak, PA-C 04/20/22 [phone]

## 2022-04-22 DIAGNOSIS — J40 Bronchitis, not specified as acute or chronic: Secondary | ICD-10-CM | POA: Diagnosis not present

## 2022-04-22 DIAGNOSIS — E663 Overweight: Secondary | ICD-10-CM | POA: Diagnosis not present

## 2022-04-22 DIAGNOSIS — Z6827 Body mass index (BMI) 27.0-27.9, adult: Secondary | ICD-10-CM | POA: Diagnosis not present

## 2022-04-22 LAB — COVID-19, FLU A+B NAA
Influenza A, NAA: NOT DETECTED
Influenza B, NAA: NOT DETECTED
SARS-CoV-2, NAA: NOT DETECTED

## 2022-06-14 DIAGNOSIS — E119 Type 2 diabetes mellitus without complications: Secondary | ICD-10-CM | POA: Diagnosis not present

## 2022-06-14 DIAGNOSIS — E785 Hyperlipidemia, unspecified: Secondary | ICD-10-CM | POA: Diagnosis not present

## 2022-06-14 DIAGNOSIS — E663 Overweight: Secondary | ICD-10-CM | POA: Diagnosis not present

## 2022-06-14 DIAGNOSIS — R69 Illness, unspecified: Secondary | ICD-10-CM | POA: Diagnosis not present

## 2022-06-14 DIAGNOSIS — Z0001 Encounter for general adult medical examination with abnormal findings: Secondary | ICD-10-CM | POA: Diagnosis not present

## 2022-06-14 DIAGNOSIS — R7309 Other abnormal glucose: Secondary | ICD-10-CM | POA: Diagnosis not present

## 2022-06-14 DIAGNOSIS — Z1331 Encounter for screening for depression: Secondary | ICD-10-CM | POA: Diagnosis not present

## 2022-06-14 DIAGNOSIS — Z6827 Body mass index (BMI) 27.0-27.9, adult: Secondary | ICD-10-CM | POA: Diagnosis not present

## 2022-06-14 DIAGNOSIS — M1991 Primary osteoarthritis, unspecified site: Secondary | ICD-10-CM | POA: Diagnosis not present

## 2022-06-14 DIAGNOSIS — I10 Essential (primary) hypertension: Secondary | ICD-10-CM | POA: Diagnosis not present

## 2022-06-14 DIAGNOSIS — F419 Anxiety disorder, unspecified: Secondary | ICD-10-CM | POA: Diagnosis not present

## 2022-06-14 DIAGNOSIS — E039 Hypothyroidism, unspecified: Secondary | ICD-10-CM | POA: Diagnosis not present

## 2022-06-14 DIAGNOSIS — Z23 Encounter for immunization: Secondary | ICD-10-CM | POA: Diagnosis not present

## 2022-08-15 DIAGNOSIS — H524 Presbyopia: Secondary | ICD-10-CM | POA: Diagnosis not present

## 2022-10-02 ENCOUNTER — Other Ambulatory Visit (HOSPITAL_COMMUNITY): Payer: Self-pay | Admitting: Internal Medicine

## 2022-10-02 DIAGNOSIS — Z1231 Encounter for screening mammogram for malignant neoplasm of breast: Secondary | ICD-10-CM

## 2022-10-09 ENCOUNTER — Ambulatory Visit (HOSPITAL_COMMUNITY)
Admission: RE | Admit: 2022-10-09 | Discharge: 2022-10-09 | Disposition: A | Discharge status: Home / Self Care | Payer: Medicare HMO | Source: Ambulatory Visit | Attending: Internal Medicine | Admitting: Internal Medicine

## 2022-10-09 DIAGNOSIS — Z1231 Encounter for screening mammogram for malignant neoplasm of breast: Secondary | ICD-10-CM | POA: Insufficient documentation

## 2022-10-16 DIAGNOSIS — Z13220 Encounter for screening for lipoid disorders: Secondary | ICD-10-CM | POA: Diagnosis not present

## 2022-11-13 DIAGNOSIS — E663 Overweight: Secondary | ICD-10-CM | POA: Diagnosis not present

## 2022-11-13 DIAGNOSIS — Z6829 Body mass index (BMI) 29.0-29.9, adult: Secondary | ICD-10-CM | POA: Diagnosis not present

## 2022-11-13 DIAGNOSIS — R030 Elevated blood-pressure reading, without diagnosis of hypertension: Secondary | ICD-10-CM | POA: Diagnosis not present

## 2022-11-13 DIAGNOSIS — U071 COVID-19: Secondary | ICD-10-CM | POA: Diagnosis not present

## 2022-11-18 DIAGNOSIS — U099 Post covid-19 condition, unspecified: Secondary | ICD-10-CM | POA: Diagnosis not present

## 2022-11-18 DIAGNOSIS — J40 Bronchitis, not specified as acute or chronic: Secondary | ICD-10-CM | POA: Diagnosis not present

## 2022-12-23 DIAGNOSIS — M25562 Pain in left knee: Secondary | ICD-10-CM | POA: Diagnosis not present

## 2023-06-17 DIAGNOSIS — F419 Anxiety disorder, unspecified: Secondary | ICD-10-CM | POA: Diagnosis not present

## 2023-06-17 DIAGNOSIS — Z1331 Encounter for screening for depression: Secondary | ICD-10-CM | POA: Diagnosis not present

## 2023-06-17 DIAGNOSIS — I10 Essential (primary) hypertension: Secondary | ICD-10-CM | POA: Diagnosis not present

## 2023-06-17 DIAGNOSIS — R7301 Impaired fasting glucose: Secondary | ICD-10-CM | POA: Diagnosis not present

## 2023-06-17 DIAGNOSIS — Z0001 Encounter for general adult medical examination with abnormal findings: Secondary | ICD-10-CM | POA: Diagnosis not present

## 2023-06-17 DIAGNOSIS — Z6826 Body mass index (BMI) 26.0-26.9, adult: Secondary | ICD-10-CM | POA: Diagnosis not present

## 2023-06-17 DIAGNOSIS — H6121 Impacted cerumen, right ear: Secondary | ICD-10-CM | POA: Diagnosis not present

## 2023-06-17 DIAGNOSIS — E663 Overweight: Secondary | ICD-10-CM | POA: Diagnosis not present

## 2023-06-17 DIAGNOSIS — E119 Type 2 diabetes mellitus without complications: Secondary | ICD-10-CM | POA: Diagnosis not present

## 2023-06-17 DIAGNOSIS — E039 Hypothyroidism, unspecified: Secondary | ICD-10-CM | POA: Diagnosis not present

## 2023-06-17 DIAGNOSIS — E785 Hyperlipidemia, unspecified: Secondary | ICD-10-CM | POA: Diagnosis not present

## 2023-06-23 NOTE — H&P (View-Only) (Signed)
GI Office Note    Referring Provider: Assunta Found, MD Primary Care Physician:  Assunta Found, MD  Primary Gastroenterologist:  Chief Complaint   No chief complaint on file.    History of Present Illness   Holly Callahan is a 72 y.o. female presenting today    Last colonoscopy 10/2011: -abnormal appearing ICV possibly a normal variant. Non-specific inflammation noted on bx.  -6mm polyp ascending colon removed, hyperplastic -next colonoscopy 10 years      Medications   Current Outpatient Medications  Medication Sig Dispense Refill   acetaminophen (TYLENOL) 500 MG tablet Take 1,000 mg by mouth as needed.     ALPRAZolam (XANAX) 0.5 MG tablet Take 0.25-0.5 mg by mouth daily. Pt usually only takes only 1/2 tab at night     amLODipine (NORVASC) 5 MG tablet Take 1 tablet (5 mg total) by mouth daily. 30 tablet 2   amoxicillin (AMOXIL) 500 MG capsule Take 2,000 mg by mouth as directed. Take 4 capsules 1 hour prior to dental appointment     atorvastatin (LIPITOR) 20 MG tablet Take 1 tablet (20 mg total) by mouth daily at 6 PM. 30 tablet 2   benzonatate (TESSALON) 100 MG capsule Take 1 capsule (100 mg total) by mouth every 8 (eight) hours. 21 capsule 0   Calcium Carb-Cholecalciferol (CALCIUM 600 + D PO) Take 1 tablet by mouth 2 (two) times daily.     clobetasol ointment (TEMOVATE) 0.05 % APPLY OINTMENT TOPICALLY TWICE DAILY TO AFFECTED AREA(S) FOR 2 WEEKS, THEN 2-3 TIMES WEEKLY 45 g 0   clopidogrel (PLAVIX) 75 MG tablet Take 1 tablet (75 mg total) by mouth daily. 30 tablet 2   fish oil-omega-3 fatty acids 1000 MG capsule Take 1 g by mouth daily.      fluticasone (FLONASE) 50 MCG/ACT nasal spray Place 1 spray into both nostrils daily. 16 g 0   hydrochlorothiazide (HYDRODIURIL) 25 MG tablet Take 25 mg by mouth daily.       levothyroxine (SYNTHROID, LEVOTHROID) 25 MCG tablet Take 25 mcg by mouth daily.       omeprazole (PRILOSEC) 40 MG capsule Take 40 mg by mouth daily.      sertraline (ZOLOFT) 25 MG tablet TAKE 1 TABLET BY MOUTH EVERY DAY (Patient not taking: Reported on 01/07/2019) 90 tablet 3   valACYclovir (VALTREX) 1000 MG tablet Take 1 tablet (1,000 mg total) by mouth daily. 30 tablet 6   No current facility-administered medications for this visit.    Allergies   Allergies as of 06/24/2023   (No Known Allergies)    Past Medical History   Past Medical History:  Diagnosis Date   Anxiety    Arthritis    GERD (gastroesophageal reflux disease)    History of TIA (transient ischemic attack) and stroke    Hypertension    Hypothyroidism    Numbness    hands bilat secondary to carpal tunnel    PONV (postoperative nausea and vomiting)     Past Surgical History   Past Surgical History:  Procedure Laterality Date   CESAREAN SECTION  1988   CHOLECYSTECTOMY     COLONOSCOPY  10/28/2011   Procedure: COLONOSCOPY;  Surgeon: Corbin Ade, MD;  [facility]: [facility];  Service: Endoscopy;  Laterality: N/A;  8:30 AM   LAPAROSCOPIC APPENDECTOMY N/A 09/25/2019   Procedure: APPENDECTOMY LAPAROSCOPIC;  Surgeon: Lucretia Roers, MD;  Location: [facility];  Service: General;  Laterality: N/A;   TOTAL KNEE ARTHROPLASTY Right  10/04/2015   Procedure: RIGHT TOTAL KNEE ARTHROPLASTY;  Surgeon: Ranee Gosselin, MD;  Location: [facility];  Service: Orthopedics;  Laterality: Right;    Past Family History   Family History  Problem Relation Age of Onset   Stroke Father    Stroke Mother    Heart attack Brother    Stroke Brother     Past Social History   Social History   Socioeconomic History   Marital status: Married    Spouse name: Not on file   Number of children: 2   Years of education: Not on file   Highest education level: Not on file  Occupational History   Not on file  Tobacco Use   Smoking status: Never   Smokeless tobacco: Never  Vaping Use   Vaping status: Never Used  Substance and Sexual Activity   Alcohol use: No   Drug use: No   Sexual  activity: Yes    Birth control/protection: Post-menopausal  Other Topics Concern   Not on file  Social History Narrative   Not on file   Social Determinants of Health   Financial Resource Strain: Not on file  Food Insecurity: Not on file  Transportation Needs: Not on file  Physical Activity: Not on file  Stress: Not on file  Social Connections: Not on file  Intimate Partner Violence: Not on file    Review of Systems   General: Negative for anorexia, weight loss, fever, chills, fatigue, weakness. Eyes: Negative for vision changes.  ENT: Negative for hoarseness, difficulty swallowing , nasal congestion. CV: Negative for chest pain, angina, palpitations, dyspnea on exertion, peripheral edema.  Respiratory: Negative for dyspnea at rest, dyspnea on exertion, cough, sputum, wheezing.  GI: See history of present illness. GU:  Negative for dysuria, hematuria, urinary incontinence, urinary frequency, nocturnal urination.  MS: Negative for joint pain, low back pain.  Derm: Negative for rash or itching.  Neuro: Negative for weakness, abnormal sensation, seizure, frequent headaches, memory loss,  confusion.  Psych: Negative for anxiety, depression, suicidal ideation, hallucinations.  Endo: Negative for unusual weight change.  Heme: Negative for bruising or bleeding. Allergy: Negative for rash or hives.  Physical Exam   There were no vitals taken for this visit.   General: Well-nourished, well-developed in no acute distress.  Head: Normocephalic, atraumatic.   Eyes: Conjunctiva pink, no icterus. Mouth: Oropharyngeal mucosa moist and pink , no lesions erythema or exudate. Neck: Supple without thyromegaly, masses, or lymphadenopathy.  Lungs: Clear to auscultation bilaterally.  Heart: Regular rate and rhythm, no murmurs rubs or gallops.  Abdomen: Bowel sounds are normal, nontender, nondistended, no hepatosplenomegaly or masses,  no abdominal bruits or hernia, no rebound or guarding.    Rectal: *** Extremities: No lower extremity edema. No clubbing or deformities.  Neuro: Alert and oriented x 4 , grossly normal neurologically.  Skin: Warm and dry, no rash or jaundice.   Psych: Alert and cooperative, normal mood and affect.  Labs   *** Imaging Studies   No results found.  Assessment       PLAN   ***   Leanna Battles. Melvyn Neth, MHS, PA-C [facility]

## 2023-06-23 NOTE — Progress Notes (Unsigned)
GI Office Note    Referring Provider: Assunta Found, MD Primary Care Physician:  Assunta Found, MD  Primary Gastroenterologist:  Chief Complaint   No chief complaint on file.    History of Present Illness   Holly Callahan is a 72 y.o. female presenting today    Last colonoscopy 10/2011: -abnormal appearing ICV possibly a normal variant. Non-specific inflammation noted on bx.  -6mm polyp ascending colon removed, hyperplastic -next colonoscopy 10 years      Medications   Current Outpatient Medications  Medication Sig Dispense Refill   acetaminophen (TYLENOL) 500 MG tablet Take 1,000 mg by mouth as needed.     ALPRAZolam (XANAX) 0.5 MG tablet Take 0.25-0.5 mg by mouth daily. Pt usually only takes only 1/2 tab at night     amLODipine (NORVASC) 5 MG tablet Take 1 tablet (5 mg total) by mouth daily. 30 tablet 2   amoxicillin (AMOXIL) 500 MG capsule Take 2,000 mg by mouth as directed. Take 4 capsules 1 hour prior to dental appointment     atorvastatin (LIPITOR) 20 MG tablet Take 1 tablet (20 mg total) by mouth daily at 6 PM. 30 tablet 2   benzonatate (TESSALON) 100 MG capsule Take 1 capsule (100 mg total) by mouth every 8 (eight) hours. 21 capsule 0   Calcium Carb-Cholecalciferol (CALCIUM 600 + D PO) Take 1 tablet by mouth 2 (two) times daily.     clobetasol ointment (TEMOVATE) 0.05 % APPLY OINTMENT TOPICALLY TWICE DAILY TO AFFECTED AREA(S) FOR 2 WEEKS, THEN 2-3 TIMES WEEKLY 45 g 0   clopidogrel (PLAVIX) 75 MG tablet Take 1 tablet (75 mg total) by mouth daily. 30 tablet 2   fish oil-omega-3 fatty acids 1000 MG capsule Take 1 g by mouth daily.      fluticasone (FLONASE) 50 MCG/ACT nasal spray Place 1 spray into both nostrils daily. 16 g 0   hydrochlorothiazide (HYDRODIURIL) 25 MG tablet Take 25 mg by mouth daily.       levothyroxine (SYNTHROID, LEVOTHROID) 25 MCG tablet Take 25 mcg by mouth daily.       omeprazole (PRILOSEC) 40 MG capsule Take 40 mg by mouth daily.      sertraline (ZOLOFT) 25 MG tablet TAKE 1 TABLET BY MOUTH EVERY DAY (Patient not taking: Reported on 01/07/2019) 90 tablet 3   valACYclovir (VALTREX) 1000 MG tablet Take 1 tablet (1,000 mg total) by mouth daily. 30 tablet 6   No current facility-administered medications for this visit.    Allergies   Allergies as of 06/24/2023   (No Known Allergies)    Past Medical History   Past Medical History:  Diagnosis Date   Anxiety    Arthritis    GERD (gastroesophageal reflux disease)    History of TIA (transient ischemic attack) and stroke    Hypertension    Hypothyroidism    Numbness    hands bilat secondary to carpal tunnel    PONV (postoperative nausea and vomiting)     Past Surgical History   Past Surgical History:  Procedure Laterality Date   CESAREAN SECTION  1988   CHOLECYSTECTOMY     COLONOSCOPY  10/28/2011   Procedure: COLONOSCOPY;  Surgeon: Corbin Ade, MD;  [facility];  Service: Endoscopy;  Laterality: N/A;  8:30 AM   LAPAROSCOPIC APPENDECTOMY N/A 09/25/2019   Procedure: APPENDECTOMY LAPAROSCOPIC;  Surgeon: Lucretia Roers, MD;  Location: [facility];  Service: General;  Laterality: N/A;   TOTAL KNEE ARTHROPLASTY Right  10/04/2015   Procedure: RIGHT TOTAL KNEE ARTHROPLASTY;  Surgeon: Ranee Gosselin, MD;  Location: [facility];  Service: Orthopedics;  Laterality: Right;    Past Family History   Family History  Problem Relation Age of Onset   Stroke Father    Stroke Mother    Heart attack Brother    Stroke Brother     Past Social History   Social History   Socioeconomic History   Marital status: Married    Spouse name: Not on file   Number of children: 2   Years of education: Not on file   Highest education level: Not on file  Occupational History   Not on file  Tobacco Use   Smoking status: Never   Smokeless tobacco: Never  Vaping Use   Vaping status: Never Used  Substance and Sexual Activity   Alcohol use: No   Drug use: No   Sexual  activity: Yes    Birth control/protection: Post-menopausal  Other Topics Concern   Not on file  Social History Narrative   Not on file   Social Determinants of Health   Financial Resource Strain: Not on file  Food Insecurity: Not on file  Transportation Needs: Not on file  Physical Activity: Not on file  Stress: Not on file  Social Connections: Not on file  Intimate Partner Violence: Not on file    Review of Systems   General: Negative for anorexia, weight loss, fever, chills, fatigue, weakness. Eyes: Negative for vision changes.  ENT: Negative for hoarseness, difficulty swallowing , nasal congestion. CV: Negative for chest pain, angina, palpitations, dyspnea on exertion, peripheral edema.  Respiratory: Negative for dyspnea at rest, dyspnea on exertion, cough, sputum, wheezing.  GI: See history of present illness. GU:  Negative for dysuria, hematuria, urinary incontinence, urinary frequency, nocturnal urination.  MS: Negative for joint pain, low back pain.  Derm: Negative for rash or itching.  Neuro: Negative for weakness, abnormal sensation, seizure, frequent headaches, memory loss,  confusion.  Psych: Negative for anxiety, depression, suicidal ideation, hallucinations.  Endo: Negative for unusual weight change.  Heme: Negative for bruising or bleeding. Allergy: Negative for rash or hives.  Physical Exam   There were no vitals taken for this visit.   General: Well-nourished, well-developed in no acute distress.  Head: Normocephalic, atraumatic.   Eyes: Conjunctiva pink, no icterus. Mouth: Oropharyngeal mucosa moist and pink , no lesions erythema or exudate. Neck: Supple without thyromegaly, masses, or lymphadenopathy.  Lungs: Clear to auscultation bilaterally.  Heart: Regular rate and rhythm, no murmurs rubs or gallops.  Abdomen: Bowel sounds are normal, nontender, nondistended, no hepatosplenomegaly or masses,  no abdominal bruits or hernia, no rebound or guarding.    Rectal: *** Extremities: No lower extremity edema. No clubbing or deformities.  Neuro: Alert and oriented x 4 , grossly normal neurologically.  Skin: Warm and dry, no rash or jaundice.   Psych: Alert and cooperative, normal mood and affect.  Labs   *** Imaging Studies   No results found.  Assessment       PLAN   ***   Leanna Battles. Melvyn Neth, MHS, PA-C [facility]

## 2023-06-24 ENCOUNTER — Encounter: Payer: Self-pay | Admitting: Gastroenterology

## 2023-06-24 ENCOUNTER — Ambulatory Visit: Payer: Medicare HMO | Admitting: Gastroenterology

## 2023-06-24 VITALS — BP 165/83 | HR 52 | Temp 97.8°F | Ht 62.0 in | Wt 153.8 lb

## 2023-06-24 DIAGNOSIS — Z1212 Encounter for screening for malignant neoplasm of rectum: Secondary | ICD-10-CM

## 2023-06-24 DIAGNOSIS — Z1211 Encounter for screening for malignant neoplasm of colon: Secondary | ICD-10-CM | POA: Diagnosis not present

## 2023-06-24 NOTE — Patient Instructions (Signed)
Colonoscopy to be scheduled. See separate instructions.  ?

## 2023-06-25 ENCOUNTER — Encounter: Payer: Self-pay | Admitting: *Deleted

## 2023-06-25 ENCOUNTER — Encounter: Payer: Self-pay | Admitting: Gastroenterology

## 2023-06-25 ENCOUNTER — Telehealth: Payer: Self-pay | Admitting: *Deleted

## 2023-06-25 NOTE — Telephone Encounter (Signed)
Pt has been scheduled for 07/23/23. Instructions printed and placed up front with sample of clenpiq.

## 2023-07-17 NOTE — Patient Instructions (Signed)
Holly Callahan  07/17/2023     @PREFPERIOPPHARMACY @   Your procedure is scheduled on  07/23/2023.   Report to [name] at  1130 A.M.   Call this number if you have problems the morning of surgery:  [phone]  If you experience any cold or flu symptoms such as cough, fever, chills, shortness of breath, etc. between now and your scheduled surgery, please notify us at the above number.   Remember:  Follow the diet and prep instructions given to you by the office.   You may drink clear liquids until 0930 am on 07/23/2023.     Clear liquids allowed are:                    Water, Black Coffee Only (No creamer, milk or cream, including half & half and powdered creamer), and Clear Sports drink (No red color; diabetics please choose diet or no sugar options)    Take these medicines the morning of surgery with A SIP OF WATER                   amlodipine, levothyroxine, omeprazole.    Do not wear jewelry, make-up or nail polish, including gel polish,  artificial nails, or any other type of covering on natural nails (fingers and  toes).  Do not wear lotions, powders, or perfumes, or deodorant.  Do not shave 48 hours prior to surgery.  Men may shave face and neck.  Do not bring valuables to the hospital.  [facility] is not responsible for any belongings or valuables.  Contacts, dentures or bridgework may not be worn into surgery.  Leave your suitcase in the car.  After surgery it may be brought to your room.  For patients admitted to the hospital, discharge time will be determined by your treatment team.  Patients discharged the day of surgery will not be allowed to drive home and must have someone with them for 24 hours.    Special instructions:   DO NOT smoke tobacco or vape for 24 hours before your procedure.  Please read over the following fact sheets that you were given. Anesthesia Post-op Instructions and Care and Recovery After Surgery        Colonoscopy,  Adult, Care After The following information offers guidance on how to care for yourself after your procedure. Your health care provider may also give you more specific instructions. If you have problems or questions, contact your health care provider. What can I expect after the procedure? After the procedure, it is common to have: A small amount of blood in your stool for 24 hours after the procedure. Some gas. Mild cramping or bloating of your abdomen. Follow these instructions at home: Eating and drinking  Drink enough fluid to keep your urine pale yellow. Follow instructions from your health care provider about eating or drinking restrictions. Resume your normal diet as told by your health care provider. Avoid heavy or fried foods that are hard to digest. Activity Rest as told by your health care provider. Avoid sitting for a long time without moving. Get up to take short walks every 1-2 hours. This is important to improve blood flow and breathing. Ask for help if you feel weak or unsteady. Return to your normal activities as told by your health care provider. Ask your health care provider what activities are safe for you. Managing cramping and bloating  Try walking around when you have cramps or feel  bloated. If directed, apply heat to your abdomen as told by your health care provider. Use the heat source that your health care provider recommends, such as a moist heat pack or a heating pad. Place a towel between your skin and the heat source. Leave the heat on for 20-30 minutes. Remove the heat if your skin turns bright red. This is especially important if you are unable to feel pain, heat, or cold. You have a greater risk of getting burned. General instructions If you were given a sedative during the procedure, it can affect you for several hours. Do not drive or operate machinery until your health care provider says that it is safe. For the first 24 hours after the procedure: Do not  sign important documents. Do not drink alcohol. Do your regular daily activities at a slower pace than normal. Eat soft foods that are easy to digest. Take over-the-counter and prescription medicines only as told by your health care provider. Keep all follow-up visits. This is important. Contact a health care provider if: You have blood in your stool 2-3 days after the procedure. Get help right away if: You have more than a small spotting of blood in your stool. You have large blood clots in your stool. You have swelling of your abdomen. You have nausea or vomiting. You have a fever. You have increasing pain in your abdomen that is not relieved with medicine. These symptoms may be an emergency. Get help right away. Call 911. Do not wait to see if the symptoms will go away. Do not drive yourself to the hospital. Summary After the procedure, it is common to have a small amount of blood in your stool. You may also have mild cramping and bloating of your abdomen. If you were given a sedative during the procedure, it can affect you for several hours. Do not drive or operate machinery until your health care provider says that it is safe. Get help right away if you have a lot of blood in your stool, nausea or vomiting, a fever, or increased pain in your abdomen. This information is not intended to replace advice given to you by your health care provider. Make sure you discuss any questions you have with your health care provider. Document Revised: 11/05/2022 Document Reviewed: 05/16/2021 Elsevier Patient Education  2024 [employer] Monitored Anesthesia Care, Care After The following information offers guidance on how to care for yourself after your procedure. Your health care provider may also give you more specific instructions. If you have problems or questions, contact your health care provider. What can I expect after the procedure? After the procedure, it is common to  have: Tiredness. Little or no memory about what happened during or after the procedure. Impaired judgment when it comes to making decisions. Nausea or vomiting. Some trouble with balance. Follow these instructions at home: For the time period you were told by your health care provider:  Rest. Do not participate in activities where you could fall or become injured. Do not drive or use machinery. Do not drink alcohol. Do not take sleeping pills or medicines that cause drowsiness. Do not make important decisions or sign legal documents. Do not take care of children on your own. Medicines Take over-the-counter and prescription medicines only as told by your health care provider. If you were prescribed antibiotics, take them as told by your health care provider. Do not stop using the antibiotic even if you start to feel better. Eating and drinking Follow  instructions from your health care provider about what you may eat and drink. Drink enough fluid to keep your urine pale yellow. If you vomit: Drink clear fluids slowly and in small amounts as you are able. Clear fluids include water, ice chips, low-calorie sports drinks, and fruit juice that has water added to it (diluted fruit juice). Eat light and bland foods in small amounts as you are able. These foods include bananas, applesauce, rice, lean meats, toast, and crackers. General instructions  Have a responsible adult stay with you for the time you are told. It is important to have someone help care for you until you are awake and alert. If you have sleep apnea, surgery and some medicines can increase your risk for breathing problems. Follow instructions from your health care provider about wearing your sleep device: When you are sleeping. This includes during daytime naps. While taking prescription pain medicines, sleeping medicines, or medicines that make you drowsy. Do not use any products that contain nicotine or tobacco. These  products include cigarettes, chewing tobacco, and vaping devices, such as e-cigarettes. If you need help quitting, ask your health care provider. Contact a health care provider if: You feel nauseous or vomit every time you eat or drink. You feel light-headed. You are still sleepy or having trouble with balance after 24 hours. You get a rash. You have a fever. You have redness or swelling around the IV site. Get help right away if: You have trouble breathing. You have new confusion after you get home. These symptoms may be an emergency. Get help right away. Call 911. Do not wait to see if the symptoms will go away. Do not drive yourself to the hospital. This information is not intended to replace advice given to you by your health care provider. Make sure you discuss any questions you have with your health care provider. Document Revised: 02/18/2022 Document Reviewed: 02/18/2022 Elsevier Patient Education  2024 [employer].

## 2023-07-18 ENCOUNTER — Encounter (HOSPITAL_COMMUNITY)
Admission: RE | Admit: 2023-07-18 | Discharge: 2023-07-18 | Disposition: A | Discharge status: Home / Self Care | Payer: Medicare HMO | Source: Ambulatory Visit | Attending: Internal Medicine | Admitting: Internal Medicine

## 2023-07-18 ENCOUNTER — Encounter (HOSPITAL_COMMUNITY): Payer: Self-pay

## 2023-07-18 VITALS — BP 159/80 | HR 59 | Temp 98.4°F | Resp 18 | Ht 62.0 in | Wt 145.1 lb

## 2023-07-18 DIAGNOSIS — Z01818 Encounter for other preprocedural examination: Secondary | ICD-10-CM | POA: Insufficient documentation

## 2023-07-18 DIAGNOSIS — I10 Essential (primary) hypertension: Secondary | ICD-10-CM | POA: Diagnosis not present

## 2023-07-18 DIAGNOSIS — Z79899 Other long term (current) drug therapy: Secondary | ICD-10-CM | POA: Insufficient documentation

## 2023-07-18 LAB — BASIC METABOLIC PANEL
Anion gap: 10 (ref 5–15)
BUN: 18 mg/dL (ref 8–23)
CO2: 25 mmol/L (ref 22–32)
Calcium: 9.6 mg/dL (ref 8.9–10.3)
Chloride: 100 mmol/L (ref 98–111)
Creatinine, Ser: 0.73 mg/dL (ref 0.44–1.00)
GFR, Estimated: >60 mL/min (ref >60)
Glucose, Bld: 108 mg/dL — ABNORMAL HIGH (ref 70–99)
Potassium: 3.7 mmol/L (ref 3.5–5.1)
Sodium: 135 mmol/L (ref 135–145)

## 2023-07-23 ENCOUNTER — Encounter (HOSPITAL_COMMUNITY): Payer: Self-pay | Admitting: Internal Medicine

## 2023-07-23 ENCOUNTER — Ambulatory Visit (HOSPITAL_COMMUNITY): Payer: Medicare HMO | Admitting: Certified Registered Nurse Anesthetist

## 2023-07-23 ENCOUNTER — Ambulatory Visit (HOSPITAL_COMMUNITY)
Admission: RE | Admit: 2023-07-23 | Discharge: 2023-07-23 | Disposition: A | Discharge status: Home / Self Care | Payer: Medicare HMO | Attending: Internal Medicine | Admitting: Internal Medicine

## 2023-07-23 ENCOUNTER — Encounter (HOSPITAL_COMMUNITY)
Admission: RE | Disposition: A | Discharge status: Home / Self Care | Payer: Self-pay | Source: Home / Self Care | Attending: Internal Medicine

## 2023-07-23 DIAGNOSIS — Z860102 Personal history of hyperplastic colon polyps: Secondary | ICD-10-CM | POA: Diagnosis not present

## 2023-07-23 DIAGNOSIS — Z79899 Other long term (current) drug therapy: Secondary | ICD-10-CM | POA: Diagnosis not present

## 2023-07-23 DIAGNOSIS — K449 Diaphragmatic hernia without obstruction or gangrene: Secondary | ICD-10-CM | POA: Diagnosis not present

## 2023-07-23 DIAGNOSIS — K649 Unspecified hemorrhoids: Secondary | ICD-10-CM | POA: Diagnosis not present

## 2023-07-23 DIAGNOSIS — Z1211 Encounter for screening for malignant neoplasm of colon: Secondary | ICD-10-CM

## 2023-07-23 DIAGNOSIS — K5730 Diverticulosis of large intestine without perforation or abscess without bleeding: Secondary | ICD-10-CM

## 2023-07-23 DIAGNOSIS — K640 First degree hemorrhoids: Secondary | ICD-10-CM | POA: Diagnosis not present

## 2023-07-23 DIAGNOSIS — I10 Essential (primary) hypertension: Secondary | ICD-10-CM | POA: Insufficient documentation

## 2023-07-23 DIAGNOSIS — E039 Hypothyroidism, unspecified: Secondary | ICD-10-CM | POA: Diagnosis not present

## 2023-07-23 DIAGNOSIS — Z8249 Family history of ischemic heart disease and other diseases of the circulatory system: Secondary | ICD-10-CM | POA: Diagnosis not present

## 2023-07-23 DIAGNOSIS — K219 Gastro-esophageal reflux disease without esophagitis: Secondary | ICD-10-CM | POA: Insufficient documentation

## 2023-07-23 HISTORY — PX: COLONOSCOPY WITH PROPOFOL: SHX5780

## 2023-07-23 SURGERY — COLONOSCOPY WITH PROPOFOL
Anesthesia: General

## 2023-07-23 MED ORDER — STERILE WATER FOR IRRIGATION IR SOLN
Status: DC | PRN
  Administered 2023-07-23 (×2): 60 mL

## 2023-07-23 MED ORDER — PROPOFOL 10 MG/ML IV BOLUS
INTRAVENOUS | Status: DC | PRN
  Administered 2023-07-23: 70 mg via INTRAVENOUS

## 2023-07-23 MED ORDER — PROPOFOL 500 MG/50ML IV EMUL
INTRAVENOUS | Status: DC | PRN
  Administered 2023-07-23: 150 ug/kg/min via INTRAVENOUS

## 2023-07-23 MED ORDER — LIDOCAINE HCL (CARDIAC) PF 100 MG/5ML IV SOSY
PREFILLED_SYRINGE | INTRAVENOUS | Status: DC | PRN
Start: 2023-07-23 — End: 2023-07-23
  Administered 2023-07-23: 60 mg via INTRATRACHEAL

## 2023-07-23 MED ORDER — PROPOFOL 500 MG/50ML IV EMUL
INTRAVENOUS | Status: AC
  Filled 2023-07-23: qty 250

## 2023-07-23 MED ORDER — SODIUM CHLORIDE 0.9 % IV SOLN: INTRAVENOUS | Status: DC | PRN

## 2023-07-23 NOTE — Anesthesia Preprocedure Evaluation (Signed)
Anesthesia Evaluation  Patient identified by MRN, date of birth, ID band Patient awake    Reviewed: Allergy & Precautions, H&P , NPO status , Patient's Chart, lab work & pertinent test results, reviewed documented beta blocker date and time   History of Anesthesia Complications (+) PONV and history of anesthetic complications  Airway Mallampati: II  TM Distance: >3 FB Neck ROM: full    Dental no notable dental hx.    Pulmonary neg pulmonary ROS   Pulmonary exam normal breath sounds clear to auscultation       Cardiovascular Exercise Tolerance: Good hypertension, negative cardio ROS  Rhythm:regular Rate:Normal     Neuro/Psych   Anxiety     TIACVA negative neurological ROS  negative psych ROS   GI/Hepatic negative GI ROS, Neg liver ROS,GERD  ,,  Endo/Other  negative endocrine ROSHypothyroidism    Renal/GU negative Renal ROS  negative genitourinary   Musculoskeletal   Abdominal   Peds  Hematology negative hematology ROS (+)   Anesthesia Other Findings   Reproductive/Obstetrics negative OB ROS                             Anesthesia Physical Anesthesia Plan  ASA: 3  Anesthesia Plan: General   Post-op Pain Management:    Induction:   PONV Risk Score and Plan: Propofol infusion  Airway Management Planned:   Additional Equipment:   Intra-op Plan:   Post-operative Plan:   Informed Consent: I have reviewed the patients History and Physical, chart, labs and discussed the procedure including the risks, benefits and alternatives for the proposed anesthesia with the patient or authorized representative who has indicated his/her understanding and acceptance.     Dental Advisory Given  Plan Discussed with: CRNA  Anesthesia Plan Comments:        Anesthesia Quick Evaluation

## 2023-07-23 NOTE — Interval H&P Note (Signed)
History and Physical Interval Note:  07/23/2023 7:23 AM  Holly Callahan  has presented today for surgery, with the diagnosis of screening.  The various methods of treatment have been discussed with the patient and family. After consideration of risks, benefits and other options for treatment, the patient has consented to  Procedure(s) with comments: COLONOSCOPY WITH PROPOFOL (N/A) - 1:30 pm, asa 3 as a surgical intervention.  The patient's history has been reviewed, patient examined, no change in status, stable for surgery.  I have reviewed the patient's chart and labs.  Questions were answered to the patient's satisfaction.     Holly Callahan  No change.  Screening colonoscopy per plan.  The risks, benefits, limitations, alternatives and imponderables have been reviewed with the patient. Questions have been answered. All parties are agreeable.

## 2023-07-23 NOTE — Transfer of Care (Signed)
Immediate Anesthesia Transfer of Care Note  Patient: Holly Callahan  Procedure(s) Performed: COLONOSCOPY WITH PROPOFOL  Patient Location: Endoscopy Unit  Anesthesia Type:General  Level of Consciousness: drowsy  Airway & Oxygen Therapy: Patient Spontanous Breathing  Post-op Assessment: Report given to RN and Post -op Vital signs reviewed and stable  Post vital signs: Reviewed and stable  Last Vitals:  Vitals Value Taken Time  BP 110/60   Temp 98   Pulse 53   Resp 16   SpO2 96     Last Pain:  Vitals:   07/23/23 0732  TempSrc:   PainSc: 0-No pain      Patients Stated Pain Goal: 8 (07/23/23 0656)  Complications: No notable events documented.

## 2023-07-23 NOTE — Discharge Instructions (Signed)
  Colonoscopy Discharge Instructions  Read the instructions outlined below and refer to this sheet in the next few weeks. These discharge instructions provide you with general information on caring for yourself after you leave the hospital. Your doctor may also give you specific instructions. While your treatment has been planned according to the most current medical practices available, unavoidable complications occasionally occur. If you have any problems or questions after discharge, call Dr. Jena Gauss at [phone]. ACTIVITY You may resume your regular activity, but move at a slower pace for the next 24 hours.  Take frequent rest periods for the next 24 hours.  Walking will help get rid of the air and reduce the bloated feeling in your belly (abdomen).  No driving for 24 hours (because of the medicine (anesthesia) used during the test).   Do not sign any important legal documents or operate any machinery for 24 hours (because of the anesthesia used during the test).  NUTRITION Drink plenty of fluids.  You may resume your normal diet as instructed by your doctor.  Begin with a light meal and progress to your normal diet. Heavy or fried foods are harder to digest and may make you feel sick to your stomach (nauseated).  Avoid alcoholic beverages for 24 hours or as instructed.  MEDICATIONS You may resume your normal medications unless your doctor tells you otherwise.  WHAT YOU CAN EXPECT TODAY Some feelings of bloating in the abdomen.  Passage of more gas than usual.  Spotting of blood in your stool or on the toilet paper.  IF YOU HAD POLYPS REMOVED DURING THE COLONOSCOPY: No aspirin products for 7 days or as instructed.  No alcohol for 7 days or as instructed.  Eat a soft diet for the next 24 hours.  FINDING OUT THE RESULTS OF YOUR TEST Not all test results are available during your visit. If your test results are not back during the visit, make an appointment with your caregiver to find out the  results. Do not assume everything is normal if you have not heard from your caregiver or the medical facility. It is important for you to follow up on all of your test results.  SEEK IMMEDIATE MEDICAL ATTENTION IF: You have more than a spotting of blood in your stool.  Your belly is swollen (abdominal distention).  You are nauseated or vomiting.  You have a temperature over 101.  You have abdominal pain or discomfort that is severe or gets worse throughout the day.    No polyps found today  You do have diverticulosis  Diverticulosis information provided  A future colonoscopy is not recommended unless new symptoms develop  At patient request, I called Holly Callahan at [phone] -reviewed findings and recommendations

## 2023-07-23 NOTE — Op Note (Signed)
[facility] Patient Name: Holly Callahan Procedure Date: 07/23/2023 7:08 AM MRN: [id number] Date of Birth: [birth year] Attending MD: Gennette Pac , MD, [id number] CSN: [id number] Age: 72 Admit Type: Outpatient Procedure:                Colonoscopy Indications:              Screening for colorectal malignant neoplasm Providers:                Gennette Pac, MD, Crystal Page, Lennice Sites                            Technician, Technician Referring MD:              Medicines:                Propofol per Anesthesia Complications:            No immediate complications. Estimated Blood Loss:     Estimated blood loss: none. Procedure:                Pre-Anesthesia Assessment:                           - Prior to the procedure, a History and Physical                            was performed, and patient medications and                            allergies were reviewed. The patient's tolerance of                            previous anesthesia was also reviewed. The risks                            and benefits of the procedure and the sedation                            options and risks were discussed with the patient.                            All questions were answered, and informed consent                            was obtained. Prior Anticoagulants: The patient has                            taken no anticoagulant or antiplatelet agents. ASA                            Grade Assessment: III - A patient with severe                            systemic disease. After reviewing the risks and  benefits, the patient was deemed in satisfactory                            condition to undergo the procedure.                           After obtaining informed consent, the colonoscope                            was passed under direct vision. Throughout the                            procedure, the patient's blood pressure, pulse, and                             oxygen saturations were monitored continuously. The                            [id number]) scope was introduced through the                            anus and advanced to the the cecum, identified by                            appendiceal orifice and ileocecal valve. The                            colonoscopy was performed without difficulty. The                            patient tolerated the procedure well. The quality                            of the bowel preparation was adequate. The                            ileocecal valve, appendiceal orifice, and rectum                            were photographed. The entire colon was well                            visualized. The colonoscopy was performed without                            difficulty. The patient tolerated the procedure                            well. The quality of the bowel preparation was                            adequate. Scope In: 7:36:06 AM Scope Out: 7:49:35 AM Scope Withdrawal Time: 0 hours 7 minutes 10 seconds  Total Procedure Duration: 0 hours 13 minutes 29 seconds  Findings:      The perianal and digital rectal examinations were normal.      A few medium-mouthed diverticula were found in the entire colon.      Non-bleeding internal hemorrhoids were found during retroflexion. The       hemorrhoids were mild, small and Grade I (internal hemorrhoids that do       not prolapse).      The exam was otherwise without abnormality on direct and retroflexion       views. Impression:               - Diverticulosis in the entire examined colon.                           - Non-bleeding internal hemorrhoids.                           - The examination was otherwise normal on direct                            and retroflexion views.                           - No specimens collected. Moderate Sedation:      Moderate (conscious) sedation was personally administered by an       anesthesia professional. The  following parameters were monitored: oxygen       saturation, heart rate, blood pressure, respiratory rate, EKG, adequacy       of pulmonary ventilation, and response to care. Recommendation:           - Patient has a contact number available for                            emergencies. The signs and symptoms of potential                            delayed complications were discussed with the                            patient. Return to normal activities tomorrow.                            Written discharge instructions were provided to the                            patient.                           - Resume previous diet.                           - Continue present medications.                           - No repeat colonoscopy due to age.                           - Return to GI clinic (date not  yet determined). Procedure Code(s):        --- Professional ---                           [phone], Colonoscopy, flexible; diagnostic, including                            collection of specimen(s) by brushing or washing,                            when performed (separate procedure) Diagnosis Code(s):        --- Professional ---                           Z12.11, Encounter for screening for malignant                            neoplasm of colon                           K64.0, First degree hemorrhoids                           K57.30, Diverticulosis of large intestine without                            perforation or abscess without bleeding CPT copyright 2022 American Medical Association. All rights reserved. The codes documented in this report are preliminary and upon coder review may  be revised to meet current compliance requirements. Gerrit Friends. Shakila Mak, MD Gennette Pac, MD 07/23/2023 7:58:38 AM This report has been signed electronically. Number of Addenda: 0

## 2023-07-24 NOTE — Anesthesia Postprocedure Evaluation (Signed)
Anesthesia Post Note  Patient: Holly Callahan  Procedure(s) Performed: COLONOSCOPY WITH PROPOFOL  Patient location during evaluation: Phase II Anesthesia Type: General Level of consciousness: awake Pain management: pain level controlled Vital Signs Assessment: post-procedure vital signs reviewed and stable Respiratory status: spontaneous breathing and respiratory function stable Cardiovascular status: blood pressure returned to baseline and stable Postop Assessment: no headache and no apparent nausea or vomiting Anesthetic complications: no Comments: Late entry   No notable events documented.   Last Vitals:  Vitals:   07/23/23 0656 07/23/23 0754  BP: 138/72 102/61  Pulse: 62 (!) 54  Resp: 16 16  Temp: 37.1 C 36.6 C  SpO2: 97% 96%    Last Pain:  Vitals:   07/23/23 0754  TempSrc: Axillary  PainSc: 0-No pain                 Windell Norfolk

## 2023-07-30 ENCOUNTER — Encounter (HOSPITAL_COMMUNITY): Payer: Self-pay | Admitting: Internal Medicine

## 2023-08-19 DIAGNOSIS — H5203 Hypermetropia, bilateral: Secondary | ICD-10-CM | POA: Diagnosis not present

## 2023-08-28 DIAGNOSIS — E663 Overweight: Secondary | ICD-10-CM | POA: Diagnosis not present

## 2023-08-28 DIAGNOSIS — Z6827 Body mass index (BMI) 27.0-27.9, adult: Secondary | ICD-10-CM | POA: Diagnosis not present

## 2023-08-28 DIAGNOSIS — S43402A Unspecified sprain of left shoulder joint, initial encounter: Secondary | ICD-10-CM | POA: Diagnosis not present

## 2023-09-26 ENCOUNTER — Other Ambulatory Visit (HOSPITAL_COMMUNITY): Payer: Self-pay | Admitting: Family Medicine

## 2023-09-26 DIAGNOSIS — Z1231 Encounter for screening mammogram for malignant neoplasm of breast: Secondary | ICD-10-CM

## 2023-10-13 ENCOUNTER — Ambulatory Visit (HOSPITAL_COMMUNITY): Payer: Medicare HMO

## 2023-11-13 ENCOUNTER — Ambulatory Visit (HOSPITAL_COMMUNITY)
Admission: RE | Admit: 2023-11-13 | Discharge: 2023-11-13 | Disposition: A | Discharge status: Home / Self Care | Payer: Medicare HMO | Source: Ambulatory Visit | Attending: Family Medicine | Admitting: Family Medicine

## 2023-11-13 DIAGNOSIS — Z1231 Encounter for screening mammogram for malignant neoplasm of breast: Secondary | ICD-10-CM | POA: Diagnosis not present

## 2023-12-24 DIAGNOSIS — J069 Acute upper respiratory infection, unspecified: Secondary | ICD-10-CM | POA: Diagnosis not present

## 2023-12-24 DIAGNOSIS — R051 Acute cough: Secondary | ICD-10-CM | POA: Diagnosis not present

## 2024-01-14 DIAGNOSIS — Z4789 Encounter for other orthopedic aftercare: Secondary | ICD-10-CM | POA: Diagnosis not present

## 2024-01-14 DIAGNOSIS — M7542 Impingement syndrome of left shoulder: Secondary | ICD-10-CM | POA: Diagnosis not present

## 2024-01-14 DIAGNOSIS — M79622 Pain in left upper arm: Secondary | ICD-10-CM | POA: Diagnosis not present

## 2024-01-14 DIAGNOSIS — S46312A Strain of muscle, fascia and tendon of triceps, left arm, initial encounter: Secondary | ICD-10-CM | POA: Diagnosis not present

## 2024-04-20 ENCOUNTER — Encounter: Payer: Self-pay | Admitting: Emergency Medicine

## 2024-04-20 ENCOUNTER — Ambulatory Visit
Admission: EM | Admit: 2024-04-20 | Discharge: 2024-04-20 | Disposition: A | Discharge status: Home / Self Care | Attending: Internal Medicine | Admitting: Internal Medicine

## 2024-04-20 ENCOUNTER — Emergency Department (HOSPITAL_COMMUNITY)
Admission: EM | Admit: 2024-04-20 | Discharge: 2024-04-20 | Disposition: A | Discharge status: Home / Self Care | Source: Ambulatory Visit | Attending: Emergency Medicine | Admitting: Emergency Medicine

## 2024-04-20 ENCOUNTER — Other Ambulatory Visit: Payer: Self-pay

## 2024-04-20 ENCOUNTER — Encounter (HOSPITAL_COMMUNITY): Payer: Self-pay

## 2024-04-20 DIAGNOSIS — T63311A Toxic effect of venom of black widow spider, accidental (unintentional), initial encounter: Secondary | ICD-10-CM

## 2024-04-20 DIAGNOSIS — Z23 Encounter for immunization: Secondary | ICD-10-CM | POA: Diagnosis not present

## 2024-04-20 DIAGNOSIS — S50862A Insect bite (nonvenomous) of left forearm, initial encounter: Secondary | ICD-10-CM | POA: Diagnosis not present

## 2024-04-20 DIAGNOSIS — W57XXXA Bitten or stung by nonvenomous insect and other nonvenomous arthropods, initial encounter: Secondary | ICD-10-CM | POA: Insufficient documentation

## 2024-04-20 DIAGNOSIS — Z7902 Long term (current) use of antithrombotics/antiplatelets: Secondary | ICD-10-CM | POA: Insufficient documentation

## 2024-04-20 DIAGNOSIS — Z79899 Other long term (current) drug therapy: Secondary | ICD-10-CM | POA: Diagnosis not present

## 2024-04-20 DIAGNOSIS — S60862A Insect bite (nonvenomous) of left wrist, initial encounter: Secondary | ICD-10-CM

## 2024-04-20 DIAGNOSIS — L539 Erythematous condition, unspecified: Secondary | ICD-10-CM | POA: Diagnosis not present

## 2024-04-20 DIAGNOSIS — T63301A Toxic effect of unspecified spider venom, accidental (unintentional), initial encounter: Secondary | ICD-10-CM

## 2024-04-20 DIAGNOSIS — S59912A Unspecified injury of left forearm, initial encounter: Secondary | ICD-10-CM | POA: Diagnosis present

## 2024-04-20 MED ORDER — TETANUS-DIPHTH-ACELL PERTUSSIS 5-2.5-18.5 LF-MCG/0.5 IM SUSY
0.5000 mL | PREFILLED_SYRINGE | Freq: Once | INTRAMUSCULAR | Status: AC
  Administered 2024-04-20: 0.5 mL via INTRAMUSCULAR

## 2024-04-20 MED ORDER — ONDANSETRON 4 MG PO TBDP: 4.0000 mg | ORAL_TABLET | Freq: Three times a day (TID) | ORAL | 0 refills | Status: AC | PRN

## 2024-04-20 MED ORDER — ACETAMINOPHEN 325 MG PO TABS
650.0000 mg | ORAL_TABLET | Freq: Once | ORAL | Status: AC
  Administered 2024-04-20: 650 mg via ORAL

## 2024-04-20 MED ORDER — OXYCODONE-ACETAMINOPHEN 5-325 MG PO TABS: 1.0000 | ORAL_TABLET | Freq: Four times a day (QID) | ORAL | 0 refills | Status: AC | PRN

## 2024-04-20 MED ORDER — OXYCODONE HCL 5 MG PO TABS
5.0000 mg | ORAL_TABLET | Freq: Once | ORAL | Status: AC
  Administered 2024-04-20: 5 mg via ORAL
  Filled 2024-04-20: qty 1

## 2024-04-20 MED ORDER — OXYCODONE-ACETAMINOPHEN 5-325 MG PO TABS
1.0000 | ORAL_TABLET | Freq: Once | ORAL | Status: AC
  Administered 2024-04-20: 1 via ORAL
  Filled 2024-04-20: qty 1

## 2024-04-20 NOTE — ED Provider Notes (Signed)
 [facility]    CSN: [id number] Arrival date & time: 04/20/24  1329      History   Chief Complaint No chief complaint on file.   HPI Holly Callahan is a 73 y.o. female.   Holly Callahan is a 73 y.o. female presenting for chief complaint of possible black widow spider bite to the ventral left wrist that happened at approximately 1 pm today while she was in her garden. She noticed the spider on the left wrist, was able to swat the spider off of her arm promptly, and saw the spider on the ground with a red dot on it's abdomen. The site of the bite has become increasingly red, swollen, and painful over the last 30 minutes since the bite prior to arrival at urgent care.  Pain is localized to the site of the spider bite.  Denies generalized myalgias, aches, headache, fever/chills, nausea, vomiting, etc. Erythema and swelling is localized to the site of the bite.  She has not attempted use of any OTC medications for pain. She's unsure of the date of her last tetanus injection. She is not a candidate for antivenom at this time Symptoms can be managed without that risk Treat pain (tramadol ) Benzodiazapine- pain If she does not have more significant wide spread muscle cramping in the next 6 hours then she can go home with pain meds  Red on the abdomen Hour glass shape?  Not certain that it was a black widow currently Degree of pain dictates whether she should be considered for antivenom  Risks associated with antivenom  Managed outpatient with   [facility]      Past Medical History:  Diagnosis Date   Anxiety    Arthritis    GERD (gastroesophageal reflux disease)    History of TIA (transient ischemic attack) and stroke    Hypertension    Hypothyroidism    Numbness    hands bilat secondary to carpal tunnel    PONV (postoperative nausea and vomiting)    TIA (transient ischemic attack) 2014    Patient Active Problem List   Diagnosis Date Noted    Black widow spider bite 04/22/2024   Spider bite 04/21/2024   Acute appendicitis    Screening for colorectal cancer 12/09/2018   History of UTI 12/09/2018   Hematuria 12/09/2018   Encounter for gynecological examination with Papanicolaou smear of cervix 12/09/2018   Lichen sclerosus et atrophicus 12/09/2018   History of herpes simplex infection 12/09/2018   Low self esteem 12/09/2018   Acute ischemic stroke (HCC) 11/10/2017   TIA (transient ischemic attack) 11/09/2017   Essential hypertension 11/09/2017   Hyperlipidemia 11/09/2017   Hypothyroidism 11/09/2017   History of total knee arthroplasty 10/04/2015    Past Surgical History:  Procedure Laterality Date   CARPAL TUNNEL RELEASE Right    CARPAL TUNNEL RELEASE Left    CESAREAN SECTION  10/07/1986   CHOLECYSTECTOMY     COLONOSCOPY  10/28/2011   Procedure: COLONOSCOPY;  Surgeon: Lamar CHRISTELLA Hollingshead, MD;  Location: [facility];  Service: Endoscopy;  Laterality: N/A;  8:30 AM   COLONOSCOPY WITH PROPOFOL  N/A 07/23/2023   Procedure: COLONOSCOPY WITH PROPOFOL ;  Surgeon: Hollingshead Lamar CHRISTELLA, MD;  Location: [facility];  Service: Endoscopy;  Laterality: N/A;  1:30 pm, asa 3   LAPAROSCOPIC APPENDECTOMY N/A 09/25/2019   Procedure: APPENDECTOMY LAPAROSCOPIC;  Surgeon: Kallie Manuelita JAYSON, MD;  Location: [facility];  Service: General;  Laterality: N/A;   TOTAL  KNEE ARTHROPLASTY Right 10/04/2015   Procedure: RIGHT TOTAL KNEE ARTHROPLASTY;  Surgeon: Tanda Heading, MD;  Location: [facility];  Service: Orthopedics;  Laterality: Right;    OB History     Gravida  2   Para  2   Term  2   Preterm      AB      Living  2      SAB      IAB      Ectopic      Multiple      Live Births  2            Home Medications    Prior to Admission medications   Medication Sig Start Date End Date Taking? Authorizing Provider  acetaminophen  (TYLENOL ) 500 MG tablet Take 1,000 mg by mouth every 8 (eight) hours as needed for mild pain (pain score  1-3).    [provider]  ALPRAZolam  (XANAX ) 0.5 MG tablet Take 0.25 mg by mouth at bedtime as needed for anxiety or sleep.    [provider]  amLODipine  (NORVASC ) 5 MG tablet Take 1 tablet (5 mg total) by mouth daily. 11/10/17   Evonnie Lenis, MD  Ascorbic Acid (VITAMIN C) 1000 MG tablet Take 1,000 mg by mouth daily.    [provider]  atorvastatin  (LIPITOR) 20 MG tablet Take 1 tablet (20 mg total) by mouth daily at 6 PM. 11/10/17   Tat, Lenis, MD  Cholecalciferol (VITAMIN D-3) 25 MCG (1000 UT) CAPS Take 1 capsule by mouth daily.    [provider]  clopidogrel  (PLAVIX ) 75 MG tablet Take 1 tablet (75 mg total) by mouth daily. 11/11/17   Evonnie Lenis, MD  cyanocobalamin  (VITAMIN B12) 1000 MCG tablet Take 1,000 mcg by mouth daily.    [provider]  gabapentin  (NEURONTIN ) 100 MG capsule Take 1 tablet by mouth 3 times a day x 7 days; then 1 tablet by mouth twice a day for 3 days; then 1 tablet by mouth daily (preferably at bedtime) for 3 days and then stop gabapentin . 04/22/24   Ricky Fines, MD  hydrochlorothiazide  (HYDRODIURIL ) 25 MG tablet Take 25 mg by mouth daily.      [provider]  levothyroxine  (SYNTHROID ) 50 MCG tablet Take 25 mcg by mouth daily.    [provider]  methocarbamol  (ROBAXIN ) 500 MG tablet Take 1 tablet (500 mg total) by mouth every 8 (eight) hours as needed for muscle spasms. 04/22/24   Ricky Fines, MD  OMEGA-3 FATTY ACIDS PO Take 500 mg by mouth daily.    [provider]  omeprazole (PRILOSEC) 20 MG capsule Take 20 mg by mouth daily.    [provider]  ondansetron  (ZOFRAN -ODT) 4 MG disintegrating tablet Take 1 tablet (4 mg total) by mouth every 8 (eight) hours as needed for nausea or vomiting. 04/20/24   Daralene Lonni BIRCH, PA-C  oxyCODONE -acetaminophen  (PERCOCET/ROXICET) 5-325 MG tablet Take 1 tablet by mouth every 6 (six) hours as needed for severe pain (pain score 7-10). 04/20/24   Daralene Lonni BIRCH, PA-C    Family History Family History  Problem Relation Age of Onset   Stroke Father    Stroke Mother    Heart attack Brother    Stroke Brother     Social History Social History   Tobacco Use   Smoking status: Never   Smokeless tobacco: Never  Vaping Use   Vaping status: Never Used  Substance Use Topics   Alcohol use: No  Drug use: No     Allergies   Patient has no known allergies.   Review of Systems Review of Systems   Physical Exam Triage Vital Signs ED Triage Vitals  Encounter Vitals Group     BP 04/20/24 1340 135/84     Girls Systolic BP Percentile --      Girls Diastolic BP Percentile --      Boys Systolic BP Percentile --      Boys Diastolic BP Percentile --      Pulse Rate 04/20/24 1340 71     Resp 04/20/24 1340 18     Temp 04/20/24 1340 97.7 F (36.5 C)     Temp Source 04/20/24 1340 Oral     SpO2 04/20/24 1340 97 %     Weight --      Height --      Head Circumference --      Peak Flow --      Pain Score 04/20/24 1341 7     Pain Loc --      Pain Education --      Exclude from Growth Chart --    No data found.  Updated Vital Signs BP 135/84 (BP Location: Right Arm)   Pulse 71   Temp 97.7 F (36.5 C) (Oral)   Resp 18   SpO2 97%   Visual Acuity Right Eye Distance:   Left Eye Distance:   Bilateral Distance:    Right Eye Near:   Left Eye Near:    Bilateral Near:     Physical Exam Vitals and nursing note reviewed.  Constitutional:      Appearance: She is not ill-appearing or toxic-appearing.  HENT:     Head: Normocephalic and atraumatic.     Right Ear: Hearing, tympanic membrane, ear canal and external ear normal.     Left Ear: Hearing, tympanic membrane, ear canal and external ear normal.     Nose: Nose normal.     Mouth/Throat:     Lips: Pink.     Mouth: Mucous membranes are moist. No injury or oral lesions.     Dentition: Normal dentition.     Tongue: No lesions.     Pharynx: Oropharynx is clear. Uvula  midline. No pharyngeal swelling, oropharyngeal exudate, posterior oropharyngeal erythema, uvula swelling or postnasal drip.     Tonsils: No tonsillar exudate.  Eyes:     General: Lids are normal. Vision grossly intact. Gaze aligned appropriately.     Extraocular Movements: Extraocular movements intact.     Conjunctiva/sclera: Conjunctivae normal.  Neck:     Trachea: Trachea and phonation normal.  Cardiovascular:     Rate and Rhythm: Normal rate and regular rhythm.     Heart sounds: Normal heart sounds, S1 normal and S2 normal.  Pulmonary:     Effort: Pulmonary effort is normal. No respiratory distress.     Breath sounds: Normal breath sounds and air entry.  Musculoskeletal:     Right wrist: Normal.     Left wrist: Swelling and tenderness present. No deformity, effusion, lacerations, bony tenderness, snuff box tenderness or crepitus. Normal range of motion. Normal pulse.     Right hand: Normal.     Left hand: Normal.     Cervical back: Neck supple.     Comments: Tenderness to palpation localized over the site of the spider bite of the left wrist. +2 left radial pulse, less than 2 cap refill distally, 5/5 left grip strength.   Lymphadenopathy:  Cervical: No cervical adenopathy.  Skin:    General: Skin is warm and dry.     Capillary Refill: Capillary refill takes less than 2 seconds.     Findings: Erythema, lesion (Spider bite mark to the left ventral wrist, superficial erythema and warmth, see images below.) and rash present.  Neurological:     General: No focal deficit present.     Mental Status: She is alert and oriented to person, place, and time. Mental status is at baseline.     GCS: GCS eye subscore is 4. GCS verbal subscore is 5. GCS motor subscore is 6.     Cranial Nerves: Cranial nerves 2-12 are intact. No dysarthria or facial asymmetry.     Sensory: Sensation is intact.     Motor: Motor function is intact. No weakness, tremor, abnormal muscle tone or pronator drift.      Coordination: Coordination is intact. Romberg sign negative. Coordination normal. Finger-Nose-Finger Test normal.     Gait: Gait is intact.     Comments: Strength and sensation intact to bilateral upper and lower extremities (5/5). Moves all 4 extremities with normal coordination voluntarily. Non-focal neuro exam.   Psychiatric:        Mood and Affect: Mood normal.        Speech: Speech normal.        Behavior: Behavior normal.        Thought Content: Thought content normal.        Judgment: Judgment normal.      Localized erythema and warmth surrounding spider bite to the ventral left wrist- this photo was taken on initial assessment approximately 45 minutes after spider bite.     New photos after 30 minutes showing progressively worsening/spreading erythema to the proximal forearm into the elbow     UC Treatments / Results  Labs (all labs ordered are listed, but only abnormal results are displayed) Labs Reviewed - No data to display  EKG   Radiology No results found.  Procedures Procedures (including critical care time)  Medications Ordered in UC Medications  acetaminophen  (TYLENOL ) tablet 650 mg (650 mg Oral Given 04/20/24 1353)  Tdap (BOOSTRIX) injection 0.5 mL (0.5 mLs Intramuscular Given 04/20/24 1417)    Initial Impression / Assessment and Plan / UC Course  I have reviewed the triage vital signs and the nursing notes.  Pertinent labs & imaging results that were available during my care of the patient were reviewed by me and considered in my medical decision making (see chart for details).   1. Spider bite wound, black widow spider bite Patient presenting with presumed black widow spider bite. Reaction is currently localized to the left wrist and she is not exhibiting any systemic signs/symptoms; however, erythema and tenderness is rapidly spreading proximally to the left forearm as seen in images above over the course of 30 minutes in clinic.  Tylenol  improved  pain slightly. Tdap updated today. Ice applied, arm elevated to reduce swelling.  Poison control [facility]) consulted (spoke with [facility]) who recommends observation for 3-4 hours, further pain relief with appropriate medications. PC states patient is not a candidate for antivenom as she is in the mild risk category and side effects of antivenom would outweigh the benefits.  Discussed case with Dr. Vonna (supervising MD) who agrees that it would be safest for patient to proceed to the ER for further workup, evaluation, monitoring, and potentially parenteral methods of pain relief given quickly worsening clinical condition.  Vitals are stable, she is without  systemic symptoms at this time, and may proceed to the ER by private car.  Discussed risks of deferring ER visit to which patient expresses understanding and agreement with plan. Discharged from urgent care in stable condition to [place] [facility].    Final Clinical Impressions(s) / UC Diagnoses   Final diagnoses:  Spider bite wound, accidental or unintentional, initial encounter  Black widow spider bite, accidental or unintentional, initial encounter     Discharge Instructions      Please go to [facility] for further monitoring, pain management, and evaluation.      ED Prescriptions   None    PDMP not reviewed this encounter.   Enedelia Dorna HERO, [place] 04/28/24 2121

## 2024-04-20 NOTE — ED Triage Notes (Signed)
 Pt arrived via POV from Urgent Care for further evaluation of pain management following a insect bite from what patient describes was a black widow spider to her left forearm. While at Urgent Care Pt receives 1000mg  Tylenol  and Tdap Booster.  Pt describes the pain as throbbing, and has skin marker around the area on her forearm where the bite occurred.

## 2024-04-20 NOTE — Discharge Instructions (Signed)
 Please follow-up closely with your primary care doctor on an outpatient basis.  Return to emergency department immediately for any new or worsening symptoms.  Please return to the emergency department for any worsening muscle spasms, abdominal pain or nausea and vomiting.

## 2024-04-20 NOTE — ED Notes (Signed)
 Pt reports she was sent here to [facility] for IV Medications, following Urgent Care calling Poison Control and being advised there was nothing further they could do for the Pt and the pt would probably be okay.

## 2024-04-20 NOTE — Discharge Instructions (Signed)
 Please go to [facility] Emergency Room for further monitoring, pain management, and evaluation.

## 2024-04-20 NOTE — ED Provider Notes (Signed)
 [place] EMERGENCY DEPARTMENT AT [facility] Provider Note   CSN: [id number] Arrival date & time: 04/20/24  1439     Patient presents with: Insect Bite   Holly Callahan is a 73 y.o. female.   Patient is a 73 year old female who presents to the emergency department the chief complaint of a black widow spider bite to her left forearm.  She notes that this occurred approximately 2 hours prior to arrival.  She was initially evaluated in urgent care and sent to the emergency department for further monitoring.  Patient notes that she has had no muscle aches, abdominal pain, nausea, vomiting.  She admits to pain at the site of the bite.  She was given Tylenol  and her tetanus shot was updated at the sending facility.        Prior to Admission medications   Medication Sig Start Date End Date Taking? Authorizing Provider  acetaminophen  (TYLENOL ) 500 MG tablet Take 1,000 mg by mouth as needed.    [provider]  ALPRAZolam  (XANAX ) 0.5 MG tablet Take 0.25-0.5 mg by mouth daily. Pt usually only takes only 1/2 tab at night    [provider]  amLODipine  (NORVASC ) 5 MG tablet Take 1 tablet (5 mg total) by mouth daily. 11/10/17   Evonnie Lenis, MD  Ascorbic Acid (VITAMIN C) 1000 MG tablet Take 1,000 mg by mouth daily.    [provider]  atorvastatin  (LIPITOR) 20 MG tablet Take 1 tablet (20 mg total) by mouth daily at 6 PM. 11/10/17   Tat, Lenis, MD  Calcium  Carb-Cholecalciferol (CALCIUM  600 + D PO) Take 1 tablet by mouth 2 (two) times daily.    [provider]  Cholecalciferol (VITAMIN D-3) 25 MCG (1000 UT) CAPS Take 1 capsule by mouth daily.    [provider]  clopidogrel  (PLAVIX ) 75 MG tablet Take 1 tablet (75 mg total) by mouth daily. 11/11/17   Evonnie Lenis, MD  cyanocobalamin  (VITAMIN B12) 1000 MCG tablet Take 1,000 mcg by mouth daily.    [provider]  hydrochlorothiazide  (HYDRODIURIL ) 25 MG tablet Take 25 mg by mouth daily.       [provider]  levothyroxine  (SYNTHROID ) 50 MCG tablet Take 25 mcg by mouth daily.    [provider]  OMEGA-3 FATTY ACIDS PO Take 500 mg by mouth daily.    [provider]  omeprazole (PRILOSEC) 20 MG capsule Take 20 mg by mouth daily.    [provider]    Allergies: Patient has no known allergies.    Review of Systems  Skin:        Spider bite to left forearm  All other systems reviewed and are negative.   Updated Vital Signs BP (!) 157/93 (BP Location: Right Arm)   Pulse (!) 56   Temp 97.8 F (36.6 C) (Oral)   Resp 14   Ht 5' 2 (1.575 m)   Wt 63.5 kg   SpO2 100%   BMI 25.61 kg/m   Physical Exam Vitals and nursing note reviewed.  Constitutional:      Appearance: Normal appearance.  HENT:     Head: Normocephalic and atraumatic.     Nose: Nose normal.     Mouth/Throat:     Mouth: Mucous membranes are moist.  Eyes:     Extraocular Movements: Extraocular movements intact.     Conjunctiva/sclera: Conjunctivae normal.     Pupils: Pupils are equal, round, and reactive to light.  Cardiovascular:  Rate and Rhythm: Normal rate and regular rhythm.     Pulses: Normal pulses.     Heart sounds: Normal heart sounds. No murmur heard.    No gallop.  Pulmonary:     Effort: Pulmonary effort is normal. No respiratory distress.     Breath sounds: Normal breath sounds. No stridor. No wheezing, rhonchi or rales.  Abdominal:     General: Abdomen is flat. Bowel sounds are normal. There is no distension.     Palpations: Abdomen is soft.     Tenderness: There is no abdominal tenderness. There is no guarding.  Musculoskeletal:        General: Normal range of motion.     Cervical back: Normal range of motion and neck supple.  Skin:    General: Skin is warm and dry.     Comments: Area of erythema noted to the volar distal aspect of the left forearm, no lymphatic streaking, no areas of fluctuance or induration  Neurological:     General: No  focal deficit present.     Mental Status: She is alert and oriented to person, place, and time. Mental status is at baseline.  Psychiatric:        Mood and Affect: Mood normal.        Behavior: Behavior normal.        Thought Content: Thought content normal.        Judgment: Judgment normal.     (all labs ordered are listed, but only abnormal results are displayed) Labs Reviewed - No data to display  EKG: None  Radiology: No results found.   Procedures   Medications Ordered in the ED  oxyCODONE  (Oxy IR/ROXICODONE ) immediate release tablet 5 mg (5 mg Oral Given 04/20/24 1559)                                    Medical Decision Making Patient is doing very well at this time and is stable for discharge home.  Patient has been monitored for approximate 6 hours post bite with no worsening of symptoms.  She denies any muscle spasms, abdominal pain, nausea or vomiting at this point.  She has been able to tolerate p.o. intake without difficulty.  Tetanus shot was updated at the urgent care.  She continues to have ongoing pain at the site of the bite with no indication for compartment syndrome.  There is no signs of necrosis at this time.  Patient has no other concerning systemic complaints.  Patient is requesting discharge at this time.  Will discharge home with continued pain control as well as antiemetics.  Strict return precautions were discussed for any new or worsening symptoms.  Patient voiced understanding and had no additional questions.  Risk Prescription drug management.        Final diagnoses:  None    ED Discharge Orders     None          Daralene Lonni JONETTA DEVONNA 04/20/24 1823    Simon Lavonia SAILOR, MD 04/20/24 2134

## 2024-04-20 NOTE — ED Notes (Signed)
 Patient is being discharged from the Urgent Care and sent to the Emergency Department via POV . Per provider, patient is in need of higher level of care due to spider bite rash spreading. Patient is aware and verbalizes understanding of plan of care.  Vitals:   04/20/24 1340  BP: 135/84  Pulse: 71  Resp: 18  Temp: 97.7 F (36.5 C)  SpO2: 97%

## 2024-04-20 NOTE — ED Triage Notes (Signed)
 Black widow spider bite on left forearm that happened around ago.  States spot is an achy pain

## 2024-04-21 ENCOUNTER — Encounter (HOSPITAL_COMMUNITY): Payer: Self-pay

## 2024-04-21 ENCOUNTER — Other Ambulatory Visit: Payer: Self-pay

## 2024-04-21 ENCOUNTER — Observation Stay (HOSPITAL_COMMUNITY)
Admission: EM | Admit: 2024-04-21 | Discharge: 2024-04-22 | Disposition: A | Discharge status: Home / Self Care | Source: Ambulatory Visit | Attending: Internal Medicine | Admitting: Internal Medicine

## 2024-04-21 DIAGNOSIS — T63301D Toxic effect of unspecified spider venom, accidental (unintentional), subsequent encounter: Secondary | ICD-10-CM

## 2024-04-21 DIAGNOSIS — E785 Hyperlipidemia, unspecified: Secondary | ICD-10-CM | POA: Diagnosis not present

## 2024-04-21 DIAGNOSIS — S50862A Insect bite (nonvenomous) of left forearm, initial encounter: Secondary | ICD-10-CM | POA: Diagnosis present

## 2024-04-21 DIAGNOSIS — E039 Hypothyroidism, unspecified: Secondary | ICD-10-CM | POA: Insufficient documentation

## 2024-04-21 DIAGNOSIS — K219 Gastro-esophageal reflux disease without esophagitis: Secondary | ICD-10-CM | POA: Insufficient documentation

## 2024-04-21 DIAGNOSIS — Z8673 Personal history of transient ischemic attack (TIA), and cerebral infarction without residual deficits: Secondary | ICD-10-CM | POA: Diagnosis not present

## 2024-04-21 DIAGNOSIS — W57XXXA Bitten or stung by nonvenomous insect and other nonvenomous arthropods, initial encounter: Secondary | ICD-10-CM | POA: Insufficient documentation

## 2024-04-21 DIAGNOSIS — T63311A Toxic effect of venom of black widow spider, accidental (unintentional), initial encounter: Secondary | ICD-10-CM | POA: Diagnosis not present

## 2024-04-21 DIAGNOSIS — Z79899 Other long term (current) drug therapy: Secondary | ICD-10-CM | POA: Insufficient documentation

## 2024-04-21 DIAGNOSIS — I10 Essential (primary) hypertension: Secondary | ICD-10-CM | POA: Insufficient documentation

## 2024-04-21 DIAGNOSIS — T63301A Toxic effect of unspecified spider venom, accidental (unintentional), initial encounter: Secondary | ICD-10-CM | POA: Diagnosis present

## 2024-04-21 DIAGNOSIS — R5381 Other malaise: Secondary | ICD-10-CM | POA: Diagnosis not present

## 2024-04-21 LAB — CBC WITH DIFFERENTIAL/PLATELET
Abs Immature Granulocytes: 0.03 K/uL (ref 0.00–0.07)
Basophils Absolute: 0 K/uL (ref 0.0–0.1)
Basophils Relative: 0 %
Eosinophils Absolute: 0 K/uL (ref 0.0–0.5)
Eosinophils Relative: 0 %
HCT: 40.6 % (ref 36.0–46.0)
Hemoglobin: 13.7 g/dL (ref 12.0–15.0)
Immature Granulocytes: 0 %
Lymphocytes Relative: 7 %
Lymphs Abs: 0.8 K/uL (ref 0.7–4.0)
MCH: 30 pg (ref 26.0–34.0)
MCHC: 33.7 g/dL (ref 30.0–36.0)
MCV: 89 fL (ref 80.0–100.0)
Monocytes Absolute: 0.6 K/uL (ref 0.1–1.0)
Monocytes Relative: 5 %
Neutro Abs: 9.7 K/uL — ABNORMAL HIGH (ref 1.7–7.7)
Neutrophils Relative %: 88 %
Platelets: 329 K/uL (ref 150–400)
RBC: 4.56 MIL/uL (ref 3.87–5.11)
RDW: 13 % (ref 11.5–15.5)
WBC: 11.1 K/uL — ABNORMAL HIGH (ref 4.0–10.5)
nRBC: 0 % (ref 0.0–0.2)

## 2024-04-21 LAB — COMPREHENSIVE METABOLIC PANEL WITH GFR
ALT: 26 U/L (ref 0–44)
AST: 25 U/L (ref 15–41)
Albumin: 4.5 g/dL (ref 3.5–5.0)
Alkaline Phosphatase: 78 U/L (ref 38–126)
Anion gap: 15 (ref 5–15)
BUN: 23 mg/dL (ref 8–23)
CO2: 25 mmol/L (ref 22–32)
Calcium: 11.9 mg/dL — ABNORMAL HIGH (ref 8.9–10.3)
Chloride: 97 mmol/L — ABNORMAL LOW (ref 98–111)
Creatinine, Ser: 0.85 mg/dL (ref 0.44–1.00)
GFR, Estimated: >60 mL/min (ref >60)
Glucose, Bld: 173 mg/dL — ABNORMAL HIGH (ref 70–99)
Potassium: 3.6 mmol/L (ref 3.5–5.1)
Sodium: 137 mmol/L (ref 135–145)
Total Bilirubin: 0.7 mg/dL (ref 0.0–1.2)
Total Protein: 7.8 g/dL (ref 6.5–8.1)

## 2024-04-21 LAB — MAGNESIUM: Magnesium: 1.9 mg/dL (ref 1.7–2.4)

## 2024-04-21 LAB — HEMOGLOBIN A1C
Hgb A1c MFr Bld: 5.9 % — ABNORMAL HIGH (ref 4.8–5.6)
Mean Plasma Glucose: 122.63 mg/dL

## 2024-04-21 LAB — PHOSPHORUS: Phosphorus: 3.8 mg/dL (ref 2.5–4.6)

## 2024-04-21 LAB — CK: Total CK: 115 U/L (ref 38–234)

## 2024-04-21 MED ORDER — ACETAMINOPHEN 650 MG RE SUPP: 650.0000 mg | Freq: Four times a day (QID) | RECTAL | Status: DC | PRN

## 2024-04-21 MED ORDER — ONDANSETRON HCL 4 MG PO TABS: 4.0000 mg | ORAL_TABLET | Freq: Four times a day (QID) | ORAL | Status: DC | PRN

## 2024-04-21 MED ORDER — ALPRAZOLAM 0.5 MG PO TABS
0.2500 mg | ORAL_TABLET | Freq: Three times a day (TID) | ORAL | Status: DC | PRN
  Administered 2024-04-21 – 2024-04-22 (×2): 0.5 mg via ORAL
  Filled 2024-04-21 (×2): qty 1

## 2024-04-21 MED ORDER — LEVOTHYROXINE SODIUM 50 MCG PO TABS
50.0000 ug | ORAL_TABLET | Freq: Every day | ORAL | Status: DC
  Administered 2024-04-22: 50 ug via ORAL
  Filled 2024-04-21: qty 1

## 2024-04-21 MED ORDER — METHOCARBAMOL 500 MG PO TABS
500.0000 mg | ORAL_TABLET | Freq: Three times a day (TID) | ORAL | Status: DC | PRN
  Administered 2024-04-21 – 2024-04-22 (×2): 500 mg via ORAL
  Filled 2024-04-21 (×2): qty 1

## 2024-04-21 MED ORDER — SODIUM CHLORIDE 0.9 % IV SOLN: INTRAVENOUS | Status: AC

## 2024-04-21 MED ORDER — GABAPENTIN 100 MG PO CAPS
100.0000 mg | ORAL_CAPSULE | Freq: Three times a day (TID) | ORAL | Status: DC
  Administered 2024-04-21 – 2024-04-22 (×4): 100 mg via ORAL
  Filled 2024-04-21 (×4): qty 1

## 2024-04-21 MED ORDER — CLOPIDOGREL BISULFATE 75 MG PO TABS
75.0000 mg | ORAL_TABLET | Freq: Every day | ORAL | Status: DC
  Administered 2024-04-22: 75 mg via ORAL
  Filled 2024-04-21: qty 1

## 2024-04-21 MED ORDER — LACTATED RINGERS IV BOLUS
1000.0000 mL | Freq: Once | INTRAVENOUS | Status: AC
  Administered 2024-04-21: 1000 mL via INTRAVENOUS

## 2024-04-21 MED ORDER — IBUPROFEN 600 MG PO TABS
600.0000 mg | ORAL_TABLET | Freq: Four times a day (QID) | ORAL | Status: DC | PRN
  Administered 2024-04-21 – 2024-04-22 (×2): 600 mg via ORAL
  Filled 2024-04-21 (×2): qty 1

## 2024-04-21 MED ORDER — AMLODIPINE BESYLATE 5 MG PO TABS
5.0000 mg | ORAL_TABLET | Freq: Every day | ORAL | Status: DC
Start: 2024-04-21 — End: 2024-04-22
  Administered 2024-04-22: 5 mg via ORAL
  Filled 2024-04-21: qty 1

## 2024-04-21 MED ORDER — MORPHINE SULFATE (PF) 2 MG/ML IV SOLN
2.0000 mg | INTRAVENOUS | Status: DC | PRN
  Administered 2024-04-21 – 2024-04-22 (×3): 2 mg via INTRAVENOUS
  Filled 2024-04-21 (×4): qty 1

## 2024-04-21 MED ORDER — PANTOPRAZOLE SODIUM 40 MG PO TBEC
40.0000 mg | DELAYED_RELEASE_TABLET | Freq: Every day | ORAL | Status: DC
  Administered 2024-04-21 – 2024-04-22 (×2): 40 mg via ORAL
  Filled 2024-04-21 (×2): qty 1

## 2024-04-21 MED ORDER — KETOROLAC TROMETHAMINE 15 MG/ML IJ SOLN
15.0000 mg | Freq: Once | INTRAMUSCULAR | Status: AC
  Administered 2024-04-21: 15 mg via INTRAVENOUS
  Filled 2024-04-21: qty 1

## 2024-04-21 MED ORDER — LIDOCAINE 5 % EX PTCH
2.0000 | MEDICATED_PATCH | CUTANEOUS | Status: DC
  Administered 2024-04-21: 2 via TRANSDERMAL
  Filled 2024-04-21 (×2): qty 2

## 2024-04-21 MED ORDER — ATORVASTATIN CALCIUM 20 MG PO TABS: 20.0000 mg | ORAL_TABLET | Freq: Every day | ORAL | Status: DC

## 2024-04-21 MED ORDER — MORPHINE SULFATE (PF) 4 MG/ML IV SOLN
2.0000 mg | Freq: Once | INTRAVENOUS | Status: AC
  Administered 2024-04-21: 2 mg via INTRAVENOUS
  Filled 2024-04-21: qty 1

## 2024-04-21 MED ORDER — VITAMIN B-12 1000 MCG PO TABS
1000.0000 ug | ORAL_TABLET | Freq: Every day | ORAL | Status: DC
  Administered 2024-04-21 – 2024-04-22 (×2): 1000 ug via ORAL
  Filled 2024-04-21 (×2): qty 1

## 2024-04-21 MED ORDER — ONDANSETRON HCL 4 MG/2ML IJ SOLN: 4.0000 mg | Freq: Four times a day (QID) | INTRAMUSCULAR | Status: DC | PRN

## 2024-04-21 MED ORDER — ACETAMINOPHEN 325 MG PO TABS: 650.0000 mg | ORAL_TABLET | Freq: Four times a day (QID) | ORAL | Status: DC | PRN

## 2024-04-21 NOTE — ED Triage Notes (Signed)
 Pt arrived via POV from home for re-evaluation of symptoms from what the Pt reports was a black widow spider bite to her left forearm. Pt seen at Urgent Care and here in [facility] yesterday for same. Pt reports taking the pain medication as prescribed w/o relief. Pt reports symptoms could be related to her anxiety as well. Pt reports she has not been drinking or eating as she should, and that her feet are in so much pain she can hardly walk. Pt denies N/V. Pts family called poison control this morning and was informed to bring the Pt back to the ED to redraw electrolyte levels, CK to rule out Rhabdo, and rehydrate with crystalloid fluids. Per reports via telephone from poison control, pain control is best with Morphine , and Benzos.

## 2024-04-21 NOTE — Plan of Care (Signed)
   Problem: Education: Goal: Knowledge of General Education information will improve Description: Including pain rating scale, medication(s)/side effects and non-pharmacologic comfort measures Outcome: Progressing   Problem: Clinical Measurements: Goal: Ability to maintain clinical measurements within normal limits will improve Outcome: Progressing Goal: Diagnostic test results will improve Outcome: Progressing

## 2024-04-21 NOTE — ED Provider Notes (Signed)
 [facility] MEDICAL SURGICAL UNIT Provider Note  CSN: [id number] Arrival date & time: 04/21/24 9077  Chief Complaint(s) Insect Bite  HPI Holly Callahan is a 73 y.o. female with PMH anxiety, GERD, HTN, hypothyroidism who presents for evaluation of myalgias, neuropathy, nausea after a black widow bite.  Patient was seen yesterday and observed for 6 hours in the emergency department and ultimately discharged home after being bit by a black widow spider.  She states that shortly after being discharged home she started to feel the generalized toxidrome with muscle spasms, neuropathy worse in the feet, chest and abdominal pain.  Symptoms worsened overnight and she called poison control back this morning who recommended that she return to the emergency department for further evaluation.  Here in the ER, she arrives fairly drowsy and states that she took a nerve pill prior to arrival.  Further history obtained from patient's daughter who states that the patient took 0.5 mg of Xanax  prior to arrival.  Patient's primary concern is bilateral lower extremity neuropathy but states that she feels very uncomfortable.  Denies chest pain, shortness of breath, headache, fever or other systemic symptoms.   Past Medical History Past Medical History:  Diagnosis Date   Anxiety    Arthritis    GERD (gastroesophageal reflux disease)    History of TIA (transient ischemic attack) and stroke    Hypertension    Hypothyroidism    Numbness    hands bilat secondary to carpal tunnel    PONV (postoperative nausea and vomiting)    TIA (transient ischemic attack) 2014   Patient Active Problem List   Diagnosis Date Noted   Spider bite 04/21/2024   Acute appendicitis    Screening for colorectal cancer 12/09/2018   History of UTI 12/09/2018   Hematuria 12/09/2018   Encounter for gynecological examination with Papanicolaou smear of cervix 12/09/2018   Lichen sclerosus et atrophicus 12/09/2018   History of herpes  simplex infection 12/09/2018   Low self esteem 12/09/2018   Acute ischemic stroke (HCC) 11/10/2017   TIA (transient ischemic attack) 11/09/2017   Essential hypertension 11/09/2017   Hyperlipidemia 11/09/2017   Hypothyroidism 11/09/2017   History of total knee arthroplasty 10/04/2015   Home Medication(s) Prior to Admission medications   Medication Sig Start Date End Date Taking? Authorizing Provider  acetaminophen  (TYLENOL ) 500 MG tablet Take 1,000 mg by mouth every 8 (eight) hours as needed for mild pain (pain score 1-3).   Yes [provider]  ALPRAZolam  (XANAX ) 0.5 MG tablet Take 0.25 mg by mouth at bedtime as needed for anxiety or sleep.   Yes [provider]  amLODipine  (NORVASC ) 5 MG tablet Take 1 tablet (5 mg total) by mouth daily. 11/10/17  Yes Tat, Alm, MD  Ascorbic Acid (VITAMIN C) 1000 MG tablet Take 1,000 mg by mouth daily.   Yes [provider]  atorvastatin  (LIPITOR) 20 MG tablet Take 1 tablet (20 mg total) by mouth daily at 6 PM. 11/10/17  Yes Tat, Alm, MD  Cholecalciferol (VITAMIN D-3) 25 MCG (1000 UT) CAPS Take 1 capsule by mouth daily.   Yes [provider]  clopidogrel  (PLAVIX ) 75 MG tablet Take 1 tablet (75 mg total) by mouth daily. 11/11/17  Yes Tat, Alm, MD  cyanocobalamin  (VITAMIN B12) 1000 MCG tablet Take 1,000 mcg by mouth daily.   Yes [provider]  hydrochlorothiazide  (HYDRODIURIL ) 25 MG tablet Take 25 mg by mouth daily.     Yes [provider]  levothyroxine  (SYNTHROID ) 50  MCG tablet Take 25 mcg by mouth daily.   Yes [provider]  OMEGA-3 FATTY ACIDS PO Take 500 mg by mouth daily.   Yes [provider]  omeprazole (PRILOSEC) 20 MG capsule Take 20 mg by mouth daily.   Yes [provider]  ondansetron  (ZOFRAN -ODT) 4 MG disintegrating tablet Take 1 tablet (4 mg total) by mouth every 8 (eight) hours as needed for nausea or vomiting. 04/20/24  Yes Daralene Lonni BIRCH, PA-C   oxyCODONE -acetaminophen  (PERCOCET/ROXICET) 5-325 MG tablet Take 1 tablet by mouth every 6 (six) hours as needed for severe pain (pain score 7-10). 04/20/24  Yes Daralene Lonni BIRCH, PA-C                                                                                                                                    Past Surgical History Past Surgical History:  Procedure Laterality Date   CARPAL TUNNEL RELEASE Right    CARPAL TUNNEL RELEASE Left    CESAREAN SECTION  10/07/1986   CHOLECYSTECTOMY     COLONOSCOPY  10/28/2011   Procedure: COLONOSCOPY;  Surgeon: Lamar CHRISTELLA Hollingshead, MD;  Location: [facility];  Service: Endoscopy;  Laterality: N/A;  8:30 AM   COLONOSCOPY WITH PROPOFOL  N/A 07/23/2023   Procedure: COLONOSCOPY WITH PROPOFOL ;  Surgeon: Hollingshead Lamar CHRISTELLA, MD;  Location: [facility];  Service: Endoscopy;  Laterality: N/A;  1:30 pm, asa 3   LAPAROSCOPIC APPENDECTOMY N/A 09/25/2019   Procedure: APPENDECTOMY LAPAROSCOPIC;  Surgeon: Kallie Manuelita BROCKS, MD;  Location: [facility];  Service: General;  Laterality: N/A;   TOTAL KNEE ARTHROPLASTY Right 10/04/2015   Procedure: RIGHT TOTAL KNEE ARTHROPLASTY;  Surgeon: Tanda Heading, MD;  Location: [facility];  Service: Orthopedics;  Laterality: Right;   Family History Family History  Problem Relation Age of Onset   Stroke Father    Stroke Mother    Heart attack Brother    Stroke Brother     Social History Social History   Tobacco Use   Smoking status: Never   Smokeless tobacco: Never  Vaping Use   Vaping status: Never Used  Substance Use Topics   Alcohol use: No   Drug use: No   Allergies Patient has no known allergies.  Review of Systems Review of Systems  Gastrointestinal:  Positive for nausea.  Musculoskeletal:  Positive for arthralgias and myalgias.    Physical Exam Vital Signs  I have reviewed the triage vital signs BP (!) 159/85 (BP Location: Right Arm)   Pulse 67   Temp 97.8 F (36.6 C) (Oral)   Resp 16   Ht 5' 2  (1.575 m)   Wt 64 kg   SpO2 94%   BMI 25.81 kg/m   Physical Exam Vitals and nursing note reviewed.  Constitutional:      General: She is not in acute distress.    Appearance: She is well-developed.  HENT:  Head: Normocephalic and atraumatic.  Eyes:     Conjunctiva/sclera: Conjunctivae normal.  Cardiovascular:     Rate and Rhythm: Normal rate and regular rhythm.     Heart sounds: No murmur heard. Pulmonary:     Effort: Pulmonary effort is normal. No respiratory distress.     Breath sounds: Normal breath sounds.  Abdominal:     Palpations: Abdomen is soft.     Tenderness: There is no abdominal tenderness.  Musculoskeletal:        General: No swelling.     Cervical back: Neck supple.  Skin:    General: Skin is warm and dry.     Capillary Refill: Capillary refill takes less than 2 seconds.  Neurological:     Mental Status: She is alert.  Psychiatric:        Mood and Affect: Mood normal.     ED Results and Treatments Labs (all labs ordered are listed, but only abnormal results are displayed) Labs Reviewed  COMPREHENSIVE METABOLIC PANEL WITH GFR - Abnormal; Notable for the following components:      Result Value   Chloride 97 (*)    Glucose, Bld 173 (*)    Calcium  11.9 (*)    All other components within normal limits  CBC WITH DIFFERENTIAL/PLATELET - Abnormal; Notable for the following components:   WBC 11.1 (*)    Neutro Abs 9.7 (*)    All other components within normal limits  HEMOGLOBIN A1C - Abnormal; Notable for the following components:   Hgb A1c MFr Bld 5.9 (*)    All other components within normal limits  CK  MAGNESIUM  PHOSPHORUS  BASIC METABOLIC PANEL WITH GFR                                                                                                                          Radiology No results found.  Pertinent labs & imaging results that were available during my care of the patient were reviewed by me and considered in my medical decision  making (see MDM for details).  Medications Ordered in ED Medications  0.9 %  sodium chloride  infusion ( Intravenous New Bag/Given 04/21/24 1415)  morphine  (PF) 2 MG/ML injection 2 mg (2 mg Intravenous Given 04/21/24 1758)  ibuprofen  (ADVIL ) tablet 600 mg (600 mg Oral Given 04/21/24 1757)  pantoprazole  (PROTONIX ) EC tablet 40 mg (40 mg Oral Given 04/21/24 1422)  methocarbamol  (ROBAXIN ) tablet 500 mg (500 mg Oral Given 04/21/24 1422)  ondansetron  (ZOFRAN ) tablet 4 mg (has no administration in time range)    Or  ondansetron  (ZOFRAN ) injection 4 mg (has no administration in time range)  acetaminophen  (TYLENOL ) tablet 650 mg (has no administration in time range)    Or  acetaminophen  (TYLENOL ) suppository 650 mg (has no administration in time range)  levothyroxine  (SYNTHROID ) tablet 50 mcg (has no administration in time range)  cyanocobalamin  (VITAMIN B12) tablet 1,000 mcg (1,000 mcg Oral Given 04/21/24 1422)  clopidogrel  (PLAVIX ) tablet 75 mg (  75 mg Oral Not Given 04/21/24 1416)  atorvastatin  (LIPITOR) tablet 20 mg (20 mg Oral Not Given 04/21/24 1721)  amLODipine  (NORVASC ) tablet 5 mg (5 mg Oral Not Given 04/21/24 1416)  ALPRAZolam  (XANAX ) tablet 0.25-0.5 mg (has no administration in time range)  gabapentin  (NEURONTIN ) capsule 100 mg (100 mg Oral Given 04/21/24 1534)  lactated ringers  bolus 1,000 mL (1,000 mLs Intravenous Bolus 04/21/24 1032)  morphine  (PF) 4 MG/ML injection 2 mg (2 mg Intravenous Given 04/21/24 1033)  ketorolac  (TORADOL ) 15 MG/ML injection 15 mg (15 mg Intravenous Given 04/21/24 1243)  morphine  (PF) 4 MG/ML injection 2 mg (2 mg Intravenous Given 04/21/24 1243)                                                                                                                                     Procedures Procedures  (including critical care time)  Medical Decision Making / ED Course   This patient presents to the ED for concern of neuropathy, nausea, this involves an extensive number  of treatment options, and is a complaint that carries with it a high risk of complications and morbidity.  The differential diagnosis includes latrodectism, electrolyte abnormality, rhabdomyolysis, neuropathy, anxiety  MDM: Patient seen in the emergency room for evaluation of myalgias, neuropathy and generalized malaise after a black widow envenomation.  Physical exam with no visible fasciculations in the tongue or extremities, previous skin markings showing erythema from previous bite appears without erythema and appears to be improving from yesterday.  Physical exam with a leukocytosis to 11.1, calcium  11.9, CK is normal.  Patient fluid resuscitated and pain controlled with morphine  and Toradol  but on reevaluation symptoms are persistent.  Do suspect that patient may have a persistent toxidrome and will require overnight observation.  I spoke with the poison center who states that antivenom likely would not be of use in this scenario as it has been extended period since the bite.  If she does develop endorgan damage this could be considered and will likely need to come from the [place] [facility].  Patient admitted.   Additional history obtained: -Additional history obtained from daughter -External records from outside source obtained and reviewed including: Chart review including previous notes, labs, imaging, consultation notes   Lab Tests: -I ordered, reviewed, and interpreted labs.   The pertinent results include:   Labs Reviewed  COMPREHENSIVE METABOLIC PANEL WITH GFR - Abnormal; Notable for the following components:      Result Value   Chloride 97 (*)    Glucose, Bld 173 (*)    Calcium  11.9 (*)    All other components within normal limits  CBC WITH DIFFERENTIAL/PLATELET - Abnormal; Notable for the following components:   WBC 11.1 (*)    Neutro Abs 9.7 (*)    All other components within normal limits  HEMOGLOBIN A1C - Abnormal; Notable for the following components:   Hgb A1c MFr Bld 5.9  (*)  All other components within normal limits  CK  MAGNESIUM  PHOSPHORUS  BASIC METABOLIC PANEL WITH GFR      Medicines ordered and prescription drug management: Meds ordered this encounter  Medications   lactated ringers  bolus 1,000 mL   morphine  (PF) 4 MG/ML injection 2 mg    Refill:  0   DISCONTD: lidocaine  (LIDODERM ) 5 % 2 patch   ketorolac  (TORADOL ) 15 MG/ML injection 15 mg   morphine  (PF) 4 MG/ML injection 2 mg    Refill:  0   0.9 %  sodium chloride  infusion   morphine  (PF) 2 MG/ML injection 2 mg   ibuprofen  (ADVIL ) tablet 600 mg   pantoprazole  (PROTONIX ) EC tablet 40 mg   methocarbamol  (ROBAXIN ) tablet 500 mg   OR Linked Order Group    ondansetron  (ZOFRAN ) tablet 4 mg    ondansetron  (ZOFRAN ) injection 4 mg   OR Linked Order Group    acetaminophen  (TYLENOL ) tablet 650 mg    acetaminophen  (TYLENOL ) suppository 650 mg   levothyroxine  (SYNTHROID ) tablet 50 mcg   cyanocobalamin  (VITAMIN B12) tablet 1,000 mcg   clopidogrel  (PLAVIX ) tablet 75 mg   atorvastatin  (LIPITOR) tablet 20 mg   amLODipine  (NORVASC ) tablet 5 mg   ALPRAZolam  (XANAX ) tablet 0.25-0.5 mg    Pt usually only takes only 1/2 tab at night     gabapentin  (NEURONTIN ) capsule 100 mg    -I have reviewed the patients home medicines and have made adjustments as needed  Critical interventions none  Consultations Obtained: I requested consultation with the poison center,  and discussed lab and imaging findings as well as pertinent plan - they recommend: Antivenom only if endorgan damage   Cardiac Monitoring: The patient was maintained on a cardiac monitor.  I personally viewed and interpreted the cardiac monitored which showed an underlying rhythm of: NSR  Social Determinants of Health:  Factors impacting patients care include: none   Reevaluation: After the interventions noted above, I reevaluated the patient and found that they have :improved  Co morbidities that complicate the patient  evaluation  Past Medical History:  Diagnosis Date   Anxiety    Arthritis    GERD (gastroesophageal reflux disease)    History of TIA (transient ischemic attack) and stroke    Hypertension    Hypothyroidism    Numbness    hands bilat secondary to carpal tunnel    PONV (postoperative nausea and vomiting)    TIA (transient ischemic attack) 2014      Dispostion: I considered admission for this patient, and patient require hospital admission for symptom control in the setting of latrodectism     Final Clinical Impression(s) / ED Diagnoses Final diagnoses:  Black widow spider bite, accidental or unintentional, initial encounter     @PCDICTATION @    Albertina Dixon, MD 04/21/24 [phone]

## 2024-04-21 NOTE — H&P (Signed)
 History and Physical    Patient: Holly Callahan FMW:[id number] DOB: 09-22-1951 DOA: 04/21/2024 DOS: the patient was seen and examined on 04/21/2024 PCP: Marvine Rush, MD   Patient coming from: Home  Chief Complaint:  Chief Complaint  Patient presents with   Insect Bite   HPI: Holly Callahan is a 73 y.o. female with medical history significant of hypertension, hypothyroidism, history of TIA and gastroesophageal reflux disease; who presented to the hospital secondary to left forearm black widow spider bite. Patient was initially seen on 04/20/2024 at the urgent care and ED; 1 after multiple hours observation final stable to go home with instruction to take pain medications and to keep a night on symptoms that will prompted return to facility. Patient reports worsening symptoms nonstop throughout the night to the point of essentially being able to walk specially with associated leg pain, cramping and essentially neuropathic sensation.  Poison control were contacted with recommendations to return to the emergency department to rule out electrolyte abnormalities, rhabdomyolysis and to provide treatment with IV fluid resuscitation and analgesic therapy.  Patient afebrile, CK level 115, calcium  11.6 (patient chronically on HCTZ and taking oral supplementation), normal renal function, normal potassium and a stable vital signs.  Fluids initiated and treatment with morphine  and NSAIDs provided.  TRH consulted to place patient in the hospital for further evaluation and management.  Review of Systems: As mentioned in the history of present illness. All other systems reviewed and are negative. Past Medical History:  Diagnosis Date   Anxiety    Arthritis    GERD (gastroesophageal reflux disease)    History of TIA (transient ischemic attack) and stroke    Hypertension    Hypothyroidism    Numbness    hands bilat secondary to carpal tunnel    PONV (postoperative nausea and vomiting)    TIA  (transient ischemic attack) 2014   Past Surgical History:  Procedure Laterality Date   CARPAL TUNNEL RELEASE Right    CARPAL TUNNEL RELEASE Left    CESAREAN SECTION  10/07/1986   CHOLECYSTECTOMY     COLONOSCOPY  10/28/2011   Procedure: COLONOSCOPY;  Surgeon: Lamar CHRISTELLA Hollingshead, MD;  Location: [facility];  Service: Endoscopy;  Laterality: N/A;  8:30 AM   COLONOSCOPY WITH PROPOFOL  N/A 07/23/2023   Procedure: COLONOSCOPY WITH PROPOFOL ;  Surgeon: Hollingshead Lamar CHRISTELLA, MD;  Location: [facility];  Service: Endoscopy;  Laterality: N/A;  1:30 pm, asa 3   LAPAROSCOPIC APPENDECTOMY N/A 09/25/2019   Procedure: APPENDECTOMY LAPAROSCOPIC;  Surgeon: Kallie Manuelita JAYSON, MD;  Location: [facility];  Service: General;  Laterality: N/A;   TOTAL KNEE ARTHROPLASTY Right 10/04/2015   Procedure: RIGHT TOTAL KNEE ARTHROPLASTY;  Surgeon: Tanda Heading, MD;  Location: [facility];  Service: Orthopedics;  Laterality: Right;   Social History:  reports that she has never smoked. She has never used smokeless tobacco. She reports that she does not drink alcohol and does not use drugs.  No Known Allergies  Family History  Problem Relation Age of Onset   Stroke Father    Stroke Mother    Heart attack Brother    Stroke Brother     Prior to Admission medications   Medication Sig Start Date End Date Taking? Authorizing Provider  acetaminophen  (TYLENOL ) 500 MG tablet Take 1,000 mg by mouth as needed.    [provider]  ALPRAZolam  (XANAX ) 0.5 MG tablet Take 0.25-0.5 mg by mouth daily. Pt usually only takes only 1/2 tab at night  [provider]  amLODipine  (NORVASC ) 5 MG tablet Take 1 tablet (5 mg total) by mouth daily. 11/10/17   Evonnie Lenis, MD  Ascorbic Acid (VITAMIN C) 1000 MG tablet Take 1,000 mg by mouth daily.    [provider]  atorvastatin  (LIPITOR) 20 MG tablet Take 1 tablet (20 mg total) by mouth daily at 6 PM. 11/10/17   Tat, Lenis, MD  Cholecalciferol (VITAMIN D-3) 25 MCG (1000 UT) CAPS  Take 1 capsule by mouth daily.    [provider]  clopidogrel  (PLAVIX ) 75 MG tablet Take 1 tablet (75 mg total) by mouth daily. 11/11/17   Evonnie Lenis, MD  cyanocobalamin  (VITAMIN B12) 1000 MCG tablet Take 1,000 mcg by mouth daily.    [provider]  hydrochlorothiazide  (HYDRODIURIL ) 25 MG tablet Take 25 mg by mouth daily.      [provider]  levothyroxine  (SYNTHROID ) 50 MCG tablet Take 25 mcg by mouth daily.    [provider]  OMEGA-3 FATTY ACIDS PO Take 500 mg by mouth daily.    [provider]  omeprazole (PRILOSEC) 20 MG capsule Take 20 mg by mouth daily.    [provider]  ondansetron  (ZOFRAN -ODT) 4 MG disintegrating tablet Take 1 tablet (4 mg total) by mouth every 8 (eight) hours as needed for nausea or vomiting. 04/20/24   Daralene Lonni BIRCH, PA-C  oxyCODONE -acetaminophen  (PERCOCET/ROXICET) 5-325 MG tablet Take 1 tablet by mouth every 6 (six) hours as needed for severe pain (pain score 7-10). 04/20/24   Daralene Lonni BIRCH, PA-C    Physical Exam: Vitals:   04/21/24 0942 04/21/24 0945  BP: (!) 161/92   Pulse: 75   Resp: 18   Temp: 97.9 F (36.6 C)   TempSrc: Oral   SpO2: 97%   Weight:  63.5 kg  Height:  5' 2 (1.575 m)   General exam: Alert, awake, oriented x 3; expressing pain in her left forearm and also pain in her legs bilaterally. Respiratory system: Clear to auscultation. Respiratory effort normal.  Good saturation on room air. Cardiovascular system:RRR. No rubs or gallops; no JVD. Gastrointestinal system: Abdomen is nondistended, soft and nontender. No organomegaly or masses felt. Normal bowel sounds heard. Central nervous system: No focal neurological deficits. Extremities: No cyanosis or clubbing. Skin: No petechiae. Psychiatry: Judgement and insight appear normal.  Slightly anxious on exam.  Data Reviewed: Comprehensive metabolic panel: Sodium 137, potassium 3.6, chloride 97, bicarb 25, BUN 23, creatinine  0.85, calcium  11.9, normal LFTs and GFR >60 Blood glucose: 173 CBC: WBCs 11.1, hemoglobin 13.7 and platelet count 329K CK: 115 : Magnesium: 1.9  Assessment and Plan: 1-spider bite -Continue fluid resuscitation, supportive care and as needed analgesics - Ibuprofen , morphine , Robaxin  and the use of Neurontin  will be provided - Follow clinical response. - No systemic changes or concern for cellulitic process currently appreciated.  2-hypercalcemia - Holding patient HCTZ while initially providing fluid resuscitation along with the use of oral calcium  supplementation - Follow electrolytes trend.  3-hypothyroidism - Continue Synthroid .  4-history of TIA - No neurologic symptoms currently appreciated - Continue Refacto modification with the use of Plavix  and statin  5-hyperlipidemia - Continue Lipitor.  6-hypertension - Continue treatment with Norvasc  - HCTZ initially on hold while providing fluid resuscitation and treating high calcium  levels.  7-GERD - Continue PPI  8-history of anxiety - Continue as needed Xanax .     Advance Care Planning:   Code Status: Full Code   Consults: none   Family Communication: No  family at bedside.  Severity of Illness: The appropriate patient status for this patient is OBSERVATION. Observation status is judged to be reasonable and necessary in order to provide the required intensity of service to ensure the patient's safety. The patient's presenting symptoms, physical exam findings, and initial radiographic and laboratory data in the context of their medical condition is felt to place them at decreased risk for further clinical deterioration. Furthermore, it is anticipated that the patient will be medically stable for discharge from the hospital within 2 midnights of admission.   Author: Eric Nunnery, MD 04/21/2024 12:27 PM  For on call review [email or web address].

## 2024-04-21 NOTE — ED Notes (Signed)
 This RN spoke with poison control and they stated that they told the pt to came back to the ED for further testing and monitoring after being bitten by a black widow. Poison Control recommended a redraw of pts electrolytes and to draw a CK to rule out rhabdo. Also recommend to keep pt well hydrated with crystalloids and manage pain with morphine . Can also use benzodiazepines: diazepam and lorazepam. Monitor pt and manage control Tx plan

## 2024-04-21 NOTE — TOC CM/SW Note (Signed)
 Transition of Care [facility]) - Inpatient Brief Assessment   Patient Details  Name: Holly Callahan MRN: [id number] Date of Birth: March 12, 1951  Transition of Care [facility]) CM/SW Contact:    Noreen KATHEE Pinal, LCSWA Phone Number: 04/21/2024, 12:08 PM   Clinical Narrative:   Transition of Care Department [facility]) has reviewed patient and no TOC needs have been identified at this time. We will continue to monitor patient advancement through interdisciplinary progression rounds. If new patient transition needs arise, please place a TOC consult.   Transition of Care Asessment: Insurance and Status: Insurance coverage has been reviewed Patient has primary care physician: Yes Home environment has been reviewed: Single Family Home Prior level of function:: Independent Prior/Current Home Services: No current home services Social Drivers of Health Review: SDOH reviewed no interventions necessary Readmission risk has been reviewed: Yes Transition of care needs: no transition of care needs at this time

## 2024-04-22 DIAGNOSIS — T63311A Toxic effect of venom of black widow spider, accidental (unintentional), initial encounter: Secondary | ICD-10-CM

## 2024-04-22 DIAGNOSIS — T63301D Toxic effect of unspecified spider venom, accidental (unintentional), subsequent encounter: Secondary | ICD-10-CM | POA: Diagnosis not present

## 2024-04-22 LAB — BASIC METABOLIC PANEL WITH GFR
Anion gap: 8 (ref 5–15)
BUN: 19 mg/dL (ref 8–23)
CO2: 23 mmol/L (ref 22–32)
Calcium: 9.7 mg/dL (ref 8.9–10.3)
Chloride: 107 mmol/L (ref 98–111)
Creatinine, Ser: 0.9 mg/dL (ref 0.44–1.00)
GFR, Estimated: >60 mL/min (ref >60)
Glucose, Bld: 118 mg/dL — ABNORMAL HIGH (ref 70–99)
Potassium: 3.2 mmol/L — ABNORMAL LOW (ref 3.5–5.1)
Sodium: 138 mmol/L (ref 135–145)

## 2024-04-22 MED ORDER — METHOCARBAMOL 500 MG PO TABS: 500.0000 mg | ORAL_TABLET | Freq: Three times a day (TID) | ORAL | 0 refills | Status: AC | PRN

## 2024-04-22 MED ORDER — GABAPENTIN 100 MG PO CAPS: ORAL_CAPSULE | ORAL | 0 refills | Status: AC

## 2024-04-22 MED ORDER — POTASSIUM CHLORIDE CRYS ER 20 MEQ PO TBCR
40.0000 meq | EXTENDED_RELEASE_TABLET | Freq: Once | ORAL | Status: AC
  Administered 2024-04-22: 40 meq via ORAL
  Filled 2024-04-22: qty 2

## 2024-04-22 NOTE — Plan of Care (Signed)
  Problem: Education: Goal: Knowledge of General Education information will improve Description: Including pain rating scale, medication(s)/side effects and non-pharmacologic comfort measures Outcome: Progressing   Problem: Health Behavior/Discharge Planning: Goal: Ability to manage health-related needs will improve Outcome: Progressing   Problem: Clinical Measurements: Goal: Ability to maintain clinical measurements within normal limits will improve Outcome: Progressing Goal: Will remain free from infection Outcome: Progressing Goal: Diagnostic test results will improve Outcome: Progressing Goal: Respiratory complications will improve Outcome: Progressing   Problem: Pain Managment: Goal: General experience of comfort will improve and/or be controlled Outcome: Progressing   Problem: Safety: Goal: Ability to remain free from injury will improve Outcome: Progressing   Problem: Skin Integrity: Goal: Risk for impaired skin integrity will decrease Outcome: Progressing

## 2024-04-22 NOTE — Care Management Obs Status (Signed)
 MEDICARE OBSERVATION STATUS NOTIFICATION   Patient Details  Name: Holly Callahan MRN: [id number] Date of Birth: 10/12/1950   Medicare Observation Status Notification Given:  Yes    AIDYNN POLENDO 04/22/2024, 12:56 PM

## 2024-04-22 NOTE — Discharge Summary (Signed)
 Physician Discharge Summary   Patient: Holly Callahan MRN: [id number] DOB: [birth year]  Admit date:     04/21/2024  Discharge date: 04/22/24  Discharge Physician: Holly Callahan   PCP: Holly Rush, MD   Recommendations at discharge:  Repeat basic metabolic panel to follow electrolytes and renal function Reassess blood pressure and adjust antihypertensive regimen as needed.  Discharge Diagnoses: Principal Problem:   Spider bite Active Problems:   Black widow spider bite  Brief Hospital admission narrative: Holly Callahan is a 73 y.o. female with medical history significant of hypertension, hypothyroidism, history of TIA and gastroesophageal reflux disease; who presented to the hospital secondary to left forearm black widow spider bite. Patient was initially seen on 04/20/2024 at the urgent care and ED; 1 after multiple hours observation final stable to go home with instruction to take pain medications and to keep a night on symptoms that will prompted return to facility. Patient reports worsening symptoms nonstop throughout the night to the point of essentially being able to walk specially with associated leg pain, cramping and essentially neuropathic sensation.  Poison control were contacted with recommendations to return to the emergency department to rule out electrolyte abnormalities, rhabdomyolysis and to provide treatment with IV fluid resuscitation and analgesic therapy.   Patient afebrile, CK level 115, calcium  11.6 (patient chronically on HCTZ and taking oral supplementation), normal renal function, normal potassium and a stable vital signs.   Fluids initiated and treatment with morphine  and NSAIDs provided.  TRH consulted to place patient in the hospital for further evaluation and management.  Assessment and Plan: 1-Black widow spider bite -Left forearm area induration continue improving, no redness, no drainage - Continue as needed analgesics and follow-up treatment with  Robaxin  and Neurontin  as prescribed - Maintain adequate hydration - Follow-up with PCP in 10 days.  2-hypercalcemia in the setting of diuretics and supplementation usage - After fluid resuscitation and holding offending agents calcium  level stabilized - At discharge we will ask patient to hold off on calcium  supplementation. - Safe to resume HCTZ - Repeat basic metabolic panel-obesity.  3-hypothyroidism - Continue Synthroid .  4-history of TIA - Continue risk factor modification - Continue treatment with Plavix  and statin for secondary prevention.  5-GERD - Continue PPI.  6-hypertension - Resume home antihypertensive agent - Heart healthy/low-sodium diet discussed with patient.  7-history of anxiety - Continue as needed Xanax .  8-hyperlipidemia - Continue Lipitor.  Consultants: None Procedures performed: Daughter bedside. Disposition: Home Diet recommendation: Heart healthy/low-sodium diet.  DISCHARGE MEDICATION: Allergies as of 04/22/2024   No Known Allergies      Medication List     TAKE these medications    acetaminophen  500 MG tablet Commonly known as: TYLENOL  Take 1,000 mg by mouth every 8 (eight) hours as needed for mild pain (pain score 1-3).   ALPRAZolam  0.5 MG tablet Commonly known as: XANAX  Take 0.25 mg by mouth at bedtime as needed for anxiety or sleep.   amLODipine  5 MG tablet Commonly known as: NORVASC  Take 1 tablet (5 mg total) by mouth daily.   atorvastatin  20 MG tablet Commonly known as: LIPITOR Take 1 tablet (20 mg total) by mouth daily at 6 PM.   clopidogrel  75 MG tablet Commonly known as: PLAVIX  Take 1 tablet (75 mg total) by mouth daily.   cyanocobalamin  1000 MCG tablet Commonly known as: VITAMIN B12 Take 1,000 mcg by mouth daily.   gabapentin  100 MG capsule Commonly known as: NEURONTIN  Take 1 tablet by mouth 3 times a day  x 7 days; then 1 tablet by mouth twice a day for 3 days; then 1 tablet by mouth daily (preferably at  bedtime) for 3 days and then stop gabapentin .   hydrochlorothiazide  25 MG tablet Commonly known as: HYDRODIURIL  Take 25 mg by mouth daily.   levothyroxine  50 MCG tablet Commonly known as: SYNTHROID  Take 25 mcg by mouth daily.   methocarbamol  500 MG tablet Commonly known as: ROBAXIN  Take 1 tablet (500 mg total) by mouth every 8 (eight) hours as needed for muscle spasms.   OMEGA-3 FATTY ACIDS PO Take 500 mg by mouth daily.   omeprazole 20 MG capsule Commonly known as: PRILOSEC Take 20 mg by mouth daily.   ondansetron  4 MG disintegrating tablet Commonly known as: ZOFRAN -ODT Take 1 tablet (4 mg total) by mouth every 8 (eight) hours as needed for nausea or vomiting.   oxyCODONE -acetaminophen  5-325 MG tablet Commonly known as: PERCOCET/ROXICET Take 1 tablet by mouth every 6 (six) hours as needed for severe pain (pain score 7-10).   vitamin C 1000 MG tablet Take 1,000 mg by mouth daily.   Vitamin D-3 25 MCG (1000 UT) Caps Take 1 capsule by mouth daily.        Follow-up Information     Holly Rush, MD. Schedule an appointment as soon as possible for a visit in 10 day(s).   Specialty: Family Medicine Contact information: [address] [place] [address] [phone]                Discharge Exam: Holly Callahan   04/21/24 0945 04/21/24 1400  Weight: 63.5 kg 64 kg   General exam: Alert, awake, oriented x 3 Respiratory system: Clear to auscultation. Respiratory effort normal. Cardiovascular system:RRR. No murmurs, rubs, gallops. Gastrointestinal system: Abdomen is nondistended, soft and nontender. No organomegaly or masses felt. Normal bowel sounds heard. Central nervous system: Alert and oriented. No focal neurological deficits. Extremities: No C/C/E, +pedal pulses Skin: No rashes, lesions or ulcers Psychiatry: Judgement and insight appear normal. Mood & affect appropriate.    Condition at discharge: Stable and improved.  The results of  significant diagnostics from this hospitalization (including imaging, microbiology, ancillary and laboratory) are listed below for reference.   Imaging Studies: No results found.  Microbiology: Results for orders placed or performed during the hospital encounter of 04/20/22  Coronavirus (COVID-19) with Influenza A and Influenza B     Status: None   Collection Time: 04/20/22  8:37 AM   Specimen: Nasopharyngeal   Naso  Result Value Ref Range Status   SARS-CoV-2, NAA Not Detected Not Detected Final   Influenza A, NAA Not Detected Not Detected Final   Influenza B, NAA Not Detected Not Detected Final   Test Information: Comment  Final    Comment: This nucleic acid amplification test was developed and its performance characteristics determined by [employer]. Nucleic acid amplification tests include RT-PCR and TMA. This test has not been FDA cleared or approved. This test has been authorized by FDA under an Emergency Use Authorization (EUA). This test is only authorized for the duration of time the declaration that circumstances exist justifying the authorization of the emergency use of in vitro diagnostic tests for detection of SARS-CoV-2 virus and/or diagnosis of COVID-19 infection under section 564(b)(1) of the Act, 21 U.S.C. 639aaa-6(a) (1), unless the authorization is terminated or revoked sooner. When diagnostic testing is negative, the possibility of a false negative result should be considered in the context of a patient's recent exposures and the presence  of clinical signs and symptoms consistent with COVID-19. An individual without symptoms of COVID-19 and who is not shedding SARS-CoV-2 virus wo uld expect to have a negative (not detected) result in this assay.     Labs: CBC: Recent Labs  Lab 04/21/24 1002  WBC 11.1*  NEUTROABS 9.7*  HGB 13.7  HCT 40.6  MCV 89.0  PLT 329   Basic Metabolic Panel: Recent Labs  Lab 04/21/24 1002 04/22/24 0453  NA 137 138   K 3.6 3.2*  CL 97* 107  CO2 25 23  GLUCOSE 173* 118*  BUN 23 19  CREATININE 0.85 0.90  CALCIUM  11.9* 9.7  MG 1.9  --   PHOS 3.8  --    Liver Function Tests: Recent Labs  Lab 04/21/24 1002  AST 25  ALT 26  ALKPHOS 78  BILITOT 0.7  PROT 7.8  ALBUMIN 4.5   CBG: No results for input(s): GLUCAP in the last 168 hours.  Discharge time spent:  28 minutes.  Signed: Eric Nunnery, MD [facility] 04/22/2024

## 2024-04-22 NOTE — Plan of Care (Signed)
  Problem: Education: Goal: Knowledge of General Education information will improve Description: Including pain rating scale, medication(s)/side effects and non-pharmacologic comfort measures Outcome: Adequate for Discharge   Problem: Clinical Measurements: Goal: Ability to maintain clinical measurements within normal limits will improve Outcome: Adequate for Discharge Goal: Diagnostic test results will improve Outcome: Adequate for Discharge

## 2024-04-22 NOTE — Progress Notes (Signed)
 Spoke to poison control who state they are resolving patient case as her condition has not deteriorated. They state to continue supportive measures as needed and recommend benzos as primary pain reilef

## 2024-04-29 DIAGNOSIS — Z6825 Body mass index (BMI) 25.0-25.9, adult: Secondary | ICD-10-CM | POA: Diagnosis not present

## 2024-04-29 DIAGNOSIS — T63311D Toxic effect of venom of black widow spider, accidental (unintentional), subsequent encounter: Secondary | ICD-10-CM | POA: Diagnosis not present

## 2024-04-29 DIAGNOSIS — E663 Overweight: Secondary | ICD-10-CM | POA: Diagnosis not present

## 2024-08-10 DIAGNOSIS — Z0000 Encounter for general adult medical examination without abnormal findings: Secondary | ICD-10-CM | POA: Diagnosis not present

## 2024-08-10 DIAGNOSIS — E039 Hypothyroidism, unspecified: Secondary | ICD-10-CM | POA: Diagnosis not present

## 2024-08-10 DIAGNOSIS — Z8673 Personal history of transient ischemic attack (TIA), and cerebral infarction without residual deficits: Secondary | ICD-10-CM | POA: Diagnosis not present

## 2024-08-10 DIAGNOSIS — F411 Generalized anxiety disorder: Secondary | ICD-10-CM | POA: Diagnosis not present

## 2024-08-10 DIAGNOSIS — E785 Hyperlipidemia, unspecified: Secondary | ICD-10-CM | POA: Diagnosis not present

## 2024-08-10 DIAGNOSIS — I10 Essential (primary) hypertension: Secondary | ICD-10-CM | POA: Diagnosis not present

## 2024-08-10 DIAGNOSIS — M1990 Unspecified osteoarthritis, unspecified site: Secondary | ICD-10-CM | POA: Diagnosis not present

## 2024-08-10 DIAGNOSIS — E119 Type 2 diabetes mellitus without complications: Secondary | ICD-10-CM | POA: Diagnosis not present

## 2024-08-24 DIAGNOSIS — H04123 Dry eye syndrome of bilateral lacrimal glands: Secondary | ICD-10-CM | POA: Diagnosis not present

## 2024-10-21 ENCOUNTER — Other Ambulatory Visit (HOSPITAL_COMMUNITY): Payer: Self-pay | Admitting: Family Medicine

## 2024-10-21 DIAGNOSIS — Z1231 Encounter for screening mammogram for malignant neoplasm of breast: Secondary | ICD-10-CM

## 2024-11-15 ENCOUNTER — Ambulatory Visit (HOSPITAL_COMMUNITY)
